# Patient Record
Sex: Female | Born: 1935 | Race: White | Hispanic: No | Smoking: Never smoker
Health system: Southern US, Community
[De-identification: ages and names within clinical notes are randomized; demographics above are authoritative.]

## PROBLEM LIST (undated history)

## (undated) DIAGNOSIS — I4891 Unspecified atrial fibrillation: Secondary | ICD-10-CM

## (undated) DIAGNOSIS — I350 Nonrheumatic aortic (valve) stenosis: Secondary | ICD-10-CM

## (undated) DIAGNOSIS — R0602 Shortness of breath: Secondary | ICD-10-CM

## (undated) HISTORY — DX: Nonrheumatic aortic (valve) stenosis: I35.0

## (undated) HISTORY — DX: Shortness of breath: R06.02

## (undated) HISTORY — DX: Unspecified atrial fibrillation: I48.91

---

## 1999-01-25 ENCOUNTER — Ambulatory Visit (HOSPITAL_COMMUNITY): Admission: RE | Admit: 1999-01-25 | Discharge: 1999-01-25 | Payer: Self-pay | Admitting: Obstetrics & Gynecology

## 1999-05-26 ENCOUNTER — Other Ambulatory Visit: Admission: RE | Admit: 1999-05-26 | Discharge: 1999-05-26 | Payer: Self-pay | Admitting: *Deleted

## 1999-06-16 ENCOUNTER — Ambulatory Visit (HOSPITAL_COMMUNITY): Admission: RE | Admit: 1999-06-16 | Discharge: 1999-06-16 | Payer: Self-pay | Admitting: *Deleted

## 2000-06-06 ENCOUNTER — Ambulatory Visit (HOSPITAL_COMMUNITY): Admission: RE | Admit: 2000-06-06 | Discharge: 2000-06-06 | Payer: Self-pay | Admitting: General Surgery

## 2000-06-06 ENCOUNTER — Encounter (HOSPITAL_BASED_OUTPATIENT_CLINIC_OR_DEPARTMENT_OTHER): Payer: Self-pay | Admitting: General Surgery

## 2000-06-15 ENCOUNTER — Encounter: Payer: Self-pay | Admitting: Internal Medicine

## 2000-06-15 ENCOUNTER — Ambulatory Visit (HOSPITAL_COMMUNITY): Admission: RE | Admit: 2000-06-15 | Discharge: 2000-06-15 | Payer: Self-pay | Admitting: Internal Medicine

## 2001-06-20 ENCOUNTER — Ambulatory Visit (HOSPITAL_COMMUNITY): Admission: RE | Admit: 2001-06-20 | Discharge: 2001-06-20 | Payer: Self-pay | Admitting: Internal Medicine

## 2001-06-20 ENCOUNTER — Encounter: Payer: Self-pay | Admitting: Internal Medicine

## 2002-06-26 ENCOUNTER — Ambulatory Visit (HOSPITAL_COMMUNITY): Admission: RE | Admit: 2002-06-26 | Discharge: 2002-06-26 | Payer: Self-pay | Admitting: Internal Medicine

## 2002-06-26 ENCOUNTER — Encounter: Payer: Self-pay | Admitting: Internal Medicine

## 2002-12-19 ENCOUNTER — Ambulatory Visit (HOSPITAL_COMMUNITY): Admission: RE | Admit: 2002-12-19 | Discharge: 2002-12-19 | Payer: Self-pay | Admitting: Gastroenterology

## 2002-12-19 ENCOUNTER — Encounter (INDEPENDENT_AMBULATORY_CARE_PROVIDER_SITE_OTHER): Payer: Self-pay | Admitting: *Deleted

## 2003-06-29 ENCOUNTER — Ambulatory Visit (HOSPITAL_COMMUNITY): Admission: RE | Admit: 2003-06-29 | Discharge: 2003-06-29 | Payer: Self-pay | Admitting: Internal Medicine

## 2004-07-29 ENCOUNTER — Ambulatory Visit (HOSPITAL_COMMUNITY): Admission: RE | Admit: 2004-07-29 | Discharge: 2004-07-29 | Payer: Self-pay | Admitting: Internal Medicine

## 2005-08-02 ENCOUNTER — Ambulatory Visit (HOSPITAL_COMMUNITY): Admission: RE | Admit: 2005-08-02 | Discharge: 2005-08-02 | Payer: Self-pay | Admitting: Internal Medicine

## 2006-08-07 ENCOUNTER — Ambulatory Visit (HOSPITAL_COMMUNITY): Admission: RE | Admit: 2006-08-07 | Discharge: 2006-08-07 | Payer: Self-pay | Admitting: Internal Medicine

## 2007-08-12 ENCOUNTER — Ambulatory Visit (HOSPITAL_COMMUNITY): Admission: RE | Admit: 2007-08-12 | Discharge: 2007-08-12 | Payer: Self-pay | Admitting: Internal Medicine

## 2008-08-13 ENCOUNTER — Ambulatory Visit (HOSPITAL_COMMUNITY): Admission: RE | Admit: 2008-08-13 | Discharge: 2008-08-13 | Payer: Self-pay | Admitting: Internal Medicine

## 2009-08-16 ENCOUNTER — Ambulatory Visit (HOSPITAL_COMMUNITY): Admission: RE | Admit: 2009-08-16 | Discharge: 2009-08-16 | Payer: Self-pay | Admitting: Internal Medicine

## 2009-12-09 ENCOUNTER — Encounter: Admission: RE | Admit: 2009-12-09 | Discharge: 2009-12-09 | Payer: Self-pay | Admitting: Internal Medicine

## 2010-07-08 ENCOUNTER — Other Ambulatory Visit (HOSPITAL_COMMUNITY): Payer: Self-pay | Admitting: Internal Medicine

## 2010-07-08 DIAGNOSIS — Z0000 Encounter for general adult medical examination without abnormal findings: Secondary | ICD-10-CM

## 2010-07-08 DIAGNOSIS — Z1231 Encounter for screening mammogram for malignant neoplasm of breast: Secondary | ICD-10-CM

## 2010-08-18 ENCOUNTER — Inpatient Hospital Stay (HOSPITAL_COMMUNITY): Admission: RE | Admit: 2010-08-18 | Payer: Self-pay | Source: Ambulatory Visit

## 2010-08-22 ENCOUNTER — Ambulatory Visit (HOSPITAL_COMMUNITY)
Admission: RE | Admit: 2010-08-22 | Discharge: 2010-08-22 | Disposition: A | Discharge status: Home / Self Care | Payer: Medicare Other | Source: Ambulatory Visit | Attending: Internal Medicine | Admitting: Internal Medicine

## 2010-08-22 DIAGNOSIS — Z1231 Encounter for screening mammogram for malignant neoplasm of breast: Secondary | ICD-10-CM | POA: Insufficient documentation

## 2010-11-04 NOTE — Op Note (Signed)
NAME:  RAINIE, CRENSHAW                        ACCOUNT NO.:  [id number]   MEDICAL RECORD NO.:  [id number]                   PATIENT TYPE:  AMB   LOCATION:  ENDO                                 FACILITY:  [facility]   PHYSICIAN:  Anselmo Rod, M.D.               DATE OF BIRTH:  02/07/1924   DATE OF PROCEDURE:  12/19/2002  DATE OF DISCHARGE:                                 OPERATIVE REPORT   PROCEDURE:  Colonoscopy with cold biopsy.   ENDOSCOPIST:  Charna Elizabeth, M.D.   INSTRUMENT USED:  Olympus video colonoscope changed to a pediatric  adjustable scope.   INDICATIONS FOR PROCEDURE:  77 year old white female undergoing screening  colonoscopy, the patient had guaiac positive stools on physical exam.   PREPROCEDURE PREPARATION:  Informed consent was obtained from the patient.  The patient was fasted for eight hours prior to the procedure and prepped  with a bottle of magnesium citrate and a gallon of GoLYTELY the night prior  to the procedure.   PREPROCEDURE PHYSICAL:  Patient with stable vital signs.  Neck supple.  Chest clear to auscultation.  S1 and S2 regular.  Abdomen soft with normal  bowel sounds.   DESCRIPTION OF PROCEDURE:  The patient was placed in the left lateral  decubitus position, sedated with 60 mg of Demerol and 6 mg Versed  intravenously.  Once the patient was adequately sedated, maintained on low  flow oxygen, continuous cardiac monitoring, the Olympus video colonoscope  was advanced into the rectum to about 50 mL with difficulty.  The adult  scope was then withdrawn and the pediatric adjustable scope was used.  This  was advanced up to the cecum with changing in the patient's position from  the left lateral to supine and right lateral position and gentle application  of abdominal pressure.  Two small sessile polyps were biopsied from the  proximal right colon.  Another small sessile polyp was biopsied from 20 cm.  There was no evidence of diverticulosis, no large  masses or polyps were  seen.  Retroflexion of the rectum revealed prominent moderate sized  nonbleeding internal hemorrhoids.  The patient tolerated the procedure well  without complications.   IMPRESSION:  1. Moderate size internal nonbleeding hemorrhoids.  2. Three small sessile polyps biopsied, two from the proximal rectum and one     from 20 cm.  3. No large masses or polyps seen.  4. No evidence of diverticulosis.             RECOMMENDATIONS:  1. Await pathology results.  2. Avoid all nonsteroidals including aspirin for the next two weeks.  3. Repeat guaiacs on an outpatient basis.  4. Outpatient follow up in the next two weeks.  Anselmo Rod, M.D.   JNM/MEDQ  D:  12/19/2002  T:  12/19/2002  Job:  [id number]   cc:   Merlene Laughter. Renae Gloss, M.D.  [address]  [address]  [place]  [place] [address]  Fax: [phone]

## 2011-06-21 ENCOUNTER — Other Ambulatory Visit (HOSPITAL_COMMUNITY): Payer: Self-pay | Admitting: Internal Medicine

## 2011-06-21 DIAGNOSIS — Z1231 Encounter for screening mammogram for malignant neoplasm of breast: Secondary | ICD-10-CM

## 2011-09-01 ENCOUNTER — Ambulatory Visit (HOSPITAL_COMMUNITY)
Admission: RE | Admit: 2011-09-01 | Discharge: 2011-09-01 | Disposition: A | Discharge status: Home / Self Care | Payer: Medicare Other | Source: Ambulatory Visit | Attending: Internal Medicine | Admitting: Internal Medicine

## 2011-09-01 DIAGNOSIS — Z1231 Encounter for screening mammogram for malignant neoplasm of breast: Secondary | ICD-10-CM | POA: Insufficient documentation

## 2012-06-25 ENCOUNTER — Other Ambulatory Visit: Payer: Self-pay | Admitting: Internal Medicine

## 2012-06-25 ENCOUNTER — Ambulatory Visit
Admission: RE | Admit: 2012-06-25 | Discharge: 2012-06-25 | Disposition: A | Discharge status: Home / Self Care | Payer: Medicare Other | Source: Ambulatory Visit | Attending: Internal Medicine | Admitting: Internal Medicine

## 2012-06-25 DIAGNOSIS — R059 Cough, unspecified: Secondary | ICD-10-CM

## 2012-06-25 DIAGNOSIS — R05 Cough: Secondary | ICD-10-CM

## 2012-07-29 ENCOUNTER — Other Ambulatory Visit (HOSPITAL_COMMUNITY): Payer: Self-pay | Admitting: Internal Medicine

## 2012-07-29 DIAGNOSIS — Z1231 Encounter for screening mammogram for malignant neoplasm of breast: Secondary | ICD-10-CM

## 2012-09-24 ENCOUNTER — Ambulatory Visit (HOSPITAL_COMMUNITY)
Admission: RE | Admit: 2012-09-24 | Discharge: 2012-09-24 | Disposition: A | Discharge status: Home / Self Care | Payer: Medicare Other | Source: Ambulatory Visit | Attending: Internal Medicine | Admitting: Internal Medicine

## 2012-09-24 DIAGNOSIS — Z1231 Encounter for screening mammogram for malignant neoplasm of breast: Secondary | ICD-10-CM | POA: Insufficient documentation

## 2020-06-16 ENCOUNTER — Other Ambulatory Visit: Payer: Self-pay | Admitting: Internal Medicine

## 2020-06-16 ENCOUNTER — Ambulatory Visit
Admission: RE | Admit: 2020-06-16 | Discharge: 2020-06-16 | Disposition: A | Discharge status: Home / Self Care | Payer: Self-pay | Source: Ambulatory Visit | Attending: Internal Medicine | Admitting: Internal Medicine

## 2020-06-16 ENCOUNTER — Other Ambulatory Visit: Payer: Self-pay

## 2020-06-16 DIAGNOSIS — J189 Pneumonia, unspecified organism: Secondary | ICD-10-CM

## 2020-06-17 ENCOUNTER — Other Ambulatory Visit: Payer: Self-pay

## 2020-06-17 ENCOUNTER — Encounter: Payer: Self-pay | Admitting: Internal Medicine

## 2020-06-17 ENCOUNTER — Ambulatory Visit (INDEPENDENT_AMBULATORY_CARE_PROVIDER_SITE_OTHER): Payer: Medicare Other | Admitting: Internal Medicine

## 2020-06-17 VITALS — BP 120/70 | HR 78 | Temp 97.7°F | Ht 60.0 in | Wt 151.0 lb

## 2020-06-17 DIAGNOSIS — Z7901 Long term (current) use of anticoagulants: Secondary | ICD-10-CM | POA: Diagnosis not present

## 2020-06-17 DIAGNOSIS — I350 Nonrheumatic aortic (valve) stenosis: Secondary | ICD-10-CM

## 2020-06-17 DIAGNOSIS — J189 Pneumonia, unspecified organism: Secondary | ICD-10-CM | POA: Diagnosis not present

## 2020-06-17 DIAGNOSIS — I482 Chronic atrial fibrillation, unspecified: Secondary | ICD-10-CM

## 2020-06-17 LAB — CBC WITH DIFFERENTIAL/PLATELET
Absolute Monocytes: 686 cells/uL (ref 200–950)
Basophils Absolute: 38 cells/uL (ref 0–200)
Basophils Relative: 0.4 %
Eosinophils Absolute: 160 cells/uL (ref 15–500)
Eosinophils Relative: 1.7 %
HCT: 40.8 % (ref 35.0–45.0)
Hemoglobin: 13.8 g/dL (ref 11.7–15.5)
Lymphs Abs: 2294 cells/uL (ref 850–3900)
MCH: 29.8 pg (ref 27.0–33.0)
MCHC: 33.8 g/dL (ref 32.0–36.0)
MCV: 88.1 fL (ref 80.0–100.0)
MPV: 10.6 fL (ref 7.5–12.5)
Monocytes Relative: 7.3 %
Neutro Abs: 6223 cells/uL (ref 1500–7800)
Neutrophils Relative %: 66.2 %
Platelets: 347 10*3/uL (ref 140–400)
RBC: 4.63 10*6/uL (ref 3.80–5.10)
RDW: 13 % (ref 11.0–15.0)
Total Lymphocyte: 24.4 %
WBC: 9.4 10*3/uL (ref 3.8–10.8)

## 2020-06-17 MED ORDER — LEVOFLOXACIN 500 MG PO TABS: 500.0000 mg | ORAL_TABLET | Freq: Every day | ORAL | 0 refills | Status: DC

## 2020-06-17 NOTE — Addendum Note (Signed)
Addended by: Margaree Mackintosh on: 06/17/2020 07:45 PM   Modules accepted: Orders, Level of Service

## 2020-06-17 NOTE — Progress Notes (Addendum)
Subjective:    Patient ID: Olivia Erickson, female    DOB: 1924-06-13, 86 y.o.   MRN: [id number]  HPI 86 year old Female currently residing in [place] seen for the first time today. Her niece is Olivia Erickson, a longstanding patient in this practice, and patient is here visiting with her.  Patient has history of A-fib and is on chronic anticoagulation.  Patient was visiting her sister in  [place] recently and was seen in the emergency department on December 23.  She was complaining of fever, shortness of breath and chest discomfort.  Has been vaccinated for COVID-19.  She takes losartan 25 mg daily, metoprolol 50 mg daily, generic Crestor 10 mg daily, Eliquis 5 mg twice daily and allopurinol 100 mg daily.  In the emergency department her temperature was 99.4 degrees.  She was offered admission but felt that she could go home.  She was noted to be tachycardic in the emergency department but pulse rate is reported as 85.  Blood pressure was 161/84 and pulse oximetry was 96%.  Temperature was 99.4 degrees.  White blood cell count was 18,000.  CO2 was low at 22.  Glucose was 178.  Sodium was 135.  COVID-19 PCR test was negative.  Influenza a and B test were negative.  RSV test was negative.  Chest x-ray showed suspected patchy pneumonia in the lower lung fields with atelectasis bilaterally.  Question of a small left pleural effusion and prominent heart size.  She is now visiting with [name] who is my patient.  [name] asked that we perform repeat chest x-ray as suggested by emergency department personnel in [place].  I asked to see the patient as well.  Repeat chest x-ray done on December 29 shows aortic atherosclerosis, stable cardiomegaly, no definite pneumonia or consolidation.  Negative for edema effusion or pneumothorax.  Patient feels well and has no new complaints.       Review of Systems see above- hx of aortic stenosis     Objective:   Physical  Exam Blood pressure 120/70 pulse 78 temperature 97.7 pulse oximetry 98% weight 151 pounds BMI 29.49  Pleasant elderly female in no acute distress.  We walked her down the hall a bit and O2 sat dropped to 93%.  She ambulates with a cane.  Skin warm and dry.  Nodes none.  TMs and pharynx are clear.  Neck is supple without JVD thyromegaly or carotid bruits.  Chest is clear to auscultation except some coarse breath sounds right lower lobe.  Cardiac exam: Irregular irregular rhythm consistent with atrial fibrillation.  2/6 short systolic ejection murmur.  No lower extremity pitting edema.  She is alert.  She is cooperative.  She is a bit hard of hearing.       Assessment & Plan:  Status post recent episode of pneumonia with elevated white blood cell count of 18,000 in [place] on December 23.  Recent chest x-ray shows no pneumonia on December 29.  She is asymptomatic.Has some coarse breath sounds RLL. Continue Levaquin for additional 5 days  Aortic stenosis  Chronic anticoagulation   Atrial fibrillation  Plan: She completed 7 day course of Levaquin 500 mg daily prescribed in [place]. Will prescribe additional 5 days today. We repeated CBC today as her white blood cell count was elevated at ER visit in [place] on December 23 at 18,000.  She is planning to return to [place] in the near future.  Records faxed to Dr.  Marrianne Mood at [facility] in [place] and Fax# is [phone]  who is her regular primary care physician

## 2020-06-17 NOTE — Patient Instructions (Addendum)
It was a pleasure to see you today.  Take additional 5 days of Levaquin 500 mg daily  We have repeated CBC today and will notify you of result when office reopens next week.

## 2020-06-30 ENCOUNTER — Telehealth: Payer: Self-pay

## 2020-06-30 NOTE — Telephone Encounter (Signed)
NOTES ON FILE FROM [facility] [phone] SENT REFERRAL TO SCHEDULING

## 2020-07-20 ENCOUNTER — Ambulatory Visit: Payer: Medicare Other | Admitting: Cardiology

## 2020-08-11 NOTE — Progress Notes (Addendum)
Cardiology Office Note:    Date:  08/12/2020   ID:  Olivia Erickson, DOB 23-Apr-1924, MRN [id number]  PCP:  Patient, No Pcp Per  Cardiologist:  No primary care provider on file.   Referring MD: No ref. provider found   Chief Complaint  Patient presents with  . Atrial Fibrillation  . Coronary Artery Disease  . Cardiac Valve Problem    Severe aortic stenosis    History of Present Illness:    Olivia Erickson is a 87 y.o. female with a hx of chronic atrial fibrillation, type 2 diabetes mellitus, severe aortic stenosis, mixed hyperlipidemia, hypertensive kidney disease, coronary atherosclerosis with multivessel disease and stent placement in the proximal RCA 2019 and mid LAD and circumflex, chronic anticoagulation therapy, decreased hearing, primary hypertension.  Previously followed by Dr. Earlyne Iba at [facility] in [facility] [place].  Last seen in October 2021 when the most recent echocardiogram was performed.  Primary care in [place], Dr. Eden Emms Baxley  This is a pleasant 87 year old who does not appear her age.  She does have difficulty hearing.  She denies syncope, angina, significant dyspnea on exertion, orthopnea, and edema.  She has a history of coronary disease.  No recent angina or nitroglycerin use.  She has moved to [place] from [facility] [place].  She understands that she has significant aortic valve stenosis and also has chronic atrial fibrillation with CAD.  She is here to establish for chronic longitudinal follow-up.  She has established with Dr. Eden Emms Baxley.  Past Medical History:  Diagnosis Date  . A-fib (HCC)   . Aortic stenosis   . SOB (shortness of breath)     History reviewed. No pertinent surgical history.  Current Medications: Current Meds  Medication Sig  . allopurinol (ZYLOPRIM) 100 MG tablet Take 100 mg by mouth daily.  . Cholecalciferol (VITAMIN D3) 50 MCG (2000 UT) TABS Take 2,000 Units/day by mouth daily.  Marland Kitchen  ELIQUIS 5 MG TABS tablet Take 2.5 mg by mouth 2 (two) times daily.  Marland Kitchen losartan (COZAAR) 25 MG tablet Take 25 mg by mouth daily.  . metoprolol succinate (TOPROL-XL) 50 MG 24 hr tablet Take 50 mg by mouth daily.  . rosuvastatin (CRESTOR) 10 MG tablet Take 10 mg by mouth daily.  . vitamin C (ASCORBIC ACID) 500 MG tablet Take 500 mg by mouth daily.     Allergies:   Patient has no known allergies.   Social History   Socioeconomic History  . Marital status: Divorced    Spouse name: Not on file  . Number of children: Not on file  . Years of education: Not on file  . Highest education level: Not on file  Occupational History  . Not on file  Tobacco Use  . Smoking status: Never Smoker  . Smokeless tobacco: Never Used  Substance and Sexual Activity  . Alcohol use: Not Currently  . Drug use: Not on file  . Sexual activity: Not on file  Other Topics Concern  . Not on file  Social History Narrative  . Not on file   Social Determinants of Health   Financial Resource Strain: Not on file  Food Insecurity: Not on file  Transportation Needs: Not on file  Physical Activity: Not on file  Stress: Not on file  Social Connections: Not on file     Family History: The patient's family history includes Heart attack in her father and mother; Heart disease in her father and mother.  ROS: No bleeding on Eliquis. Please see the history of present illness.    Decreased hearing, joint discomfort associated with osteoarthritis.  All other systems reviewed and are negative.  EKGs/Labs/Other Studies Reviewed:    The following studies were reviewed today:  2D Doppler echocardiogram March 22, 2020:  [id number]%  PA systolic pressure 30 mmHg  Severe aortic stenosis with mean gradient 38 mmHg and peak instantaneous velocity 3.8 m/s with calculated aortic valve area 0.46 cm  EKG:  EKG atrial fibrillation, nonspecific T wave flattening, otherwise unremarkable.  Recent Labs: 06/17/2020:  Hemoglobin 13.8; Platelets 347  Recent Lipid Panel No results found for: CHOL, TRIG, HDL, CHOLHDL, VLDL, LDLCALC, LDLDIRECT  Physical Exam:    VS:  BP 122/74   Pulse 83   Ht 5\' 3"  (1.6 m)   Wt 153 lb 3.2 oz (69.5 kg)   SpO2 97%   BMI 27.14 kg/m     Wt Readings from Last 3 Encounters:  08/12/20 153 lb 3.2 oz (69.5 kg)  06/17/20 151 lb (68.5 kg)     GEN: Slightly overweight, and appears younger than stated age.. No acute distress HEENT: Normal NECK: No JVD. LYMPHATICS: No lymphadenopathy CARDIAC: 3/6 to 4/6 crescendo decrescendo aortic stenosis murmur at right upper sternal and left mid sternal positions..  Irregularly irregular RR without gallop, or edema. VASCULAR:  Normal Pulses. No bruits. RESPIRATORY:  Clear to auscultation without rales, wheezing or rhonchi  ABDOMEN: Soft, non-tender, non-distended, No pulsatile mass, MUSCULOSKELETAL: No deformity  SKIN: Warm and dry NEUROLOGIC:  Alert and oriented x 3 PSYCHIATRIC:  Normal affect   ASSESSMENT:    1. Aortic stenosis, severe   2. Chronic atrial fibrillation (HCC)   3. Coronary artery disease of native artery of native heart with stable angina pectoris (HCC)   4. Primary hypertension   5. Type 2 diabetes mellitus with complication, without long-term current use of insulin (HCC)   6. Chronic anticoagulation   7. Educated about COVID-19 virus infection    PLAN:    In order of problems listed above:  1. Severe aortic stenosis and is relatively well-preserved 87 year old.  We discussed clinical management.  Had a conversation concerning her acceptance of the clinical condition and preferences for management.  At this point, she is not opposed to performing percutaneous valve replacement.  No real time line.  Did discuss the need to perform the procedure when she is clinically optimal as this would give the best result.  Made it clear that any decision concerning procedure related management would be a shared decision  process and based upon recommendations from the structural heart/aortic valve clinic.  Plan perform an echo in April and compare to the October echocardiogram.  Clinical follow-up here in the office at that time.  Decision concerning moving forward with TAVR evaluation will be determined at that time.  She is ambulatory, independent, and not particularly frail.  If she is going to have a procedure, it should be done while she is physically optimal. However, even when optimal, she is 87 yo which increases the chance for poor outcome. In absence of symptoms, my inclination is conservative management. 2. Controlled rate.  Continue chronic anticoagulation. 3. Secondary prevention.  Monitor for symptoms of angina. 4. Continue current medical therapy.  No change in regimen.  Therapy will be continuation of losartan 25 mg/day, Toprol-XL 50 mg/day. 5. No specific therapy at this time. 6. Continue Eliquis currently on 2.5 mg/day.  This may be an under treatment as kidney function  and weight would dictate a higher dose.  Will revisit with pharmacy. 7. Vaccinated and has not suffered COVID-19.   Medication Adjustments/Labs and Tests Ordered: Current medicines are reviewed at length with the patient today.  Concerns regarding medicines are outlined above.  Orders Placed This Encounter  Procedures  . EKG 12-Lead  . ECHOCARDIOGRAM COMPLETE   No orders of the defined types were placed in this encounter.   Patient Instructions  Medication Instructions:  Your physician recommends that you continue on your current medications as directed. Please refer to the Current Medication list given to you today.  *If you need a refill on your cardiac medications before your next appointment, please call your pharmacy*   Lab Work: None If you have labs (blood work) drawn today and your tests are completely normal, you will receive your results only by: Marland Kitchen MyChart Message (if you have MyChart) OR . A paper copy in the  mail If you have any lab test that is abnormal or we need to change your treatment, we will call you to review the results.   Testing/Procedures: Your physician has requested that you have an echocardiogram. Echocardiography is a painless test that uses sound waves to create images of your heart. It provides your doctor with information about the size and shape of your heart and how well your heart's chambers and valves are working. This procedure takes approximately one hour. There are no restrictions for this procedure.     Follow-Up: At [facility], you and your health needs are our priority.  As part of our continuing mission to provide you with exceptional heart care, we have created designated Provider Care Teams.  These Care Teams include your primary Cardiologist (physician) and Advanced Practice Providers (APPs -  Physician Assistants and Nurse Practitioners) who all work together to provide you with the care you need, when you need it.  We recommend signing up for the patient portal called "MyChart".  Sign up information is provided on this After Visit Summary.  MyChart is used to connect with patients for Virtual Visits (Telemedicine).  Patients are able to view lab/test results, encounter notes, upcoming appointments, etc.  Non-urgent messages can be sent to your provider as well.   To learn more about what you can do with MyChart, go to [email or web address].    Your next appointment:   2 month(s)  The format for your next appointment:   In Person  Provider:   You may see Dr. Verdis Prime or one of the following Advanced Practice Providers on your designated Care Team:    Georgie Chard, NP    Other Instructions      Signed, Lesleigh Noe, MD  08/12/2020 5:28 PM    [facility]

## 2020-08-12 ENCOUNTER — Encounter: Payer: Self-pay | Admitting: Interventional Cardiology

## 2020-08-12 ENCOUNTER — Other Ambulatory Visit: Payer: Self-pay

## 2020-08-12 ENCOUNTER — Ambulatory Visit: Payer: Medicare Other | Admitting: Interventional Cardiology

## 2020-08-12 VITALS — BP 122/74 | HR 83 | Ht 63.0 in | Wt 153.2 lb

## 2020-08-12 DIAGNOSIS — I482 Chronic atrial fibrillation, unspecified: Secondary | ICD-10-CM

## 2020-08-12 DIAGNOSIS — I350 Nonrheumatic aortic (valve) stenosis: Secondary | ICD-10-CM

## 2020-08-12 DIAGNOSIS — I10 Essential (primary) hypertension: Secondary | ICD-10-CM | POA: Diagnosis not present

## 2020-08-12 DIAGNOSIS — Z7189 Other specified counseling: Secondary | ICD-10-CM

## 2020-08-12 DIAGNOSIS — I25118 Atherosclerotic heart disease of native coronary artery with other forms of angina pectoris: Secondary | ICD-10-CM

## 2020-08-12 DIAGNOSIS — E118 Type 2 diabetes mellitus with unspecified complications: Secondary | ICD-10-CM

## 2020-08-12 DIAGNOSIS — Z7901 Long term (current) use of anticoagulants: Secondary | ICD-10-CM

## 2020-08-12 NOTE — Patient Instructions (Signed)
Medication Instructions:  Your physician recommends that you continue on your current medications as directed. Please refer to the Current Medication list given to you today.  *If you need a refill on your cardiac medications before your next appointment, please call your pharmacy*   Lab Work: None If you have labs (blood work) drawn today and your tests are completely normal, you will receive your results only by: Marland Kitchen MyChart Message (if you have MyChart) OR . A paper copy in the mail If you have any lab test that is abnormal or we need to change your treatment, we will call you to review the results.   Testing/Procedures: Your physician has requested that you have an echocardiogram. Echocardiography is a painless test that uses sound waves to create images of your heart. It provides your doctor with information about the size and shape of your heart and how well your heart's chambers and valves are working. This procedure takes approximately one hour. There are no restrictions for this procedure.     Follow-Up: At [facility], you and your health needs are our priority.  As part of our continuing mission to provide you with exceptional heart care, we have created designated Provider Care Teams.  These Care Teams include your primary Cardiologist (physician) and Advanced Practice Providers (APPs -  Physician Assistants and Nurse Practitioners) who all work together to provide you with the care you need, when you need it.  We recommend signing up for the patient portal called "MyChart".  Sign up information is provided on this After Visit Summary.  MyChart is used to connect with patients for Virtual Visits (Telemedicine).  Patients are able to view lab/test results, encounter notes, upcoming appointments, etc.  Non-urgent messages can be sent to your provider as well.   To learn more about what you can do with MyChart, go to [email or web address].    Your next appointment:   2  month(s)  The format for your next appointment:   In Person  Provider:   You may see Dr. Verdis Prime or one of the following Advanced Practice Providers on your designated Care Team:    Georgie Chard, NP    Other Instructions

## 2020-08-15 ENCOUNTER — Other Ambulatory Visit: Payer: Self-pay

## 2020-08-15 ENCOUNTER — Emergency Department (HOSPITAL_COMMUNITY): Payer: Medicare Other

## 2020-08-15 ENCOUNTER — Emergency Department (HOSPITAL_COMMUNITY)
Admission: EM | Admit: 2020-08-15 | Discharge: 2020-08-15 | Disposition: A | Discharge status: Home / Self Care | Payer: Medicare Other | Attending: Emergency Medicine | Admitting: Emergency Medicine

## 2020-08-15 ENCOUNTER — Encounter (HOSPITAL_COMMUNITY): Payer: Self-pay

## 2020-08-15 DIAGNOSIS — E119 Type 2 diabetes mellitus without complications: Secondary | ICD-10-CM | POA: Insufficient documentation

## 2020-08-15 DIAGNOSIS — R011 Cardiac murmur, unspecified: Secondary | ICD-10-CM | POA: Diagnosis not present

## 2020-08-15 DIAGNOSIS — I129 Hypertensive chronic kidney disease with stage 1 through stage 4 chronic kidney disease, or unspecified chronic kidney disease: Secondary | ICD-10-CM | POA: Insufficient documentation

## 2020-08-15 DIAGNOSIS — Z7901 Long term (current) use of anticoagulants: Secondary | ICD-10-CM | POA: Diagnosis not present

## 2020-08-15 DIAGNOSIS — R42 Dizziness and giddiness: Secondary | ICD-10-CM | POA: Insufficient documentation

## 2020-08-15 DIAGNOSIS — I2510 Atherosclerotic heart disease of native coronary artery without angina pectoris: Secondary | ICD-10-CM | POA: Diagnosis not present

## 2020-08-15 DIAGNOSIS — R102 Pelvic and perineal pain: Secondary | ICD-10-CM | POA: Insufficient documentation

## 2020-08-15 DIAGNOSIS — F0390 Unspecified dementia without behavioral disturbance: Secondary | ICD-10-CM | POA: Insufficient documentation

## 2020-08-15 DIAGNOSIS — N183 Chronic kidney disease, stage 3 unspecified: Secondary | ICD-10-CM | POA: Diagnosis not present

## 2020-08-15 DIAGNOSIS — W19XXXA Unspecified fall, initial encounter: Secondary | ICD-10-CM | POA: Diagnosis not present

## 2020-08-15 DIAGNOSIS — Z79899 Other long term (current) drug therapy: Secondary | ICD-10-CM | POA: Diagnosis not present

## 2020-08-15 LAB — CBC WITH DIFFERENTIAL/PLATELET
Abs Immature Granulocytes: 0.07 10*3/uL (ref 0.00–0.07)
Basophils Absolute: 0 10*3/uL (ref 0.0–0.1)
Basophils Relative: 0 %
Eosinophils Absolute: 0.1 10*3/uL (ref 0.0–0.5)
Eosinophils Relative: 0 %
HCT: 43.1 % (ref 36.0–46.0)
Hemoglobin: 14.2 g/dL (ref 12.0–15.0)
Immature Granulocytes: 1 %
Lymphocytes Relative: 11 %
Lymphs Abs: 1.4 10*3/uL (ref 0.7–4.0)
MCH: 28.9 pg (ref 26.0–34.0)
MCHC: 32.9 g/dL (ref 30.0–36.0)
MCV: 87.6 fL (ref 80.0–100.0)
Monocytes Absolute: 0.8 10*3/uL (ref 0.1–1.0)
Monocytes Relative: 6 %
Neutro Abs: 10.7 10*3/uL — ABNORMAL HIGH (ref 1.7–7.7)
Neutrophils Relative %: 82 %
Platelets: 227 10*3/uL (ref 150–400)
RBC: 4.92 MIL/uL (ref 3.87–5.11)
RDW: 13.5 % (ref 11.5–15.5)
WBC: 13 10*3/uL — ABNORMAL HIGH (ref 4.0–10.5)
nRBC: 0 % (ref 0.0–0.2)

## 2020-08-15 LAB — COMPREHENSIVE METABOLIC PANEL
ALT: 16 U/L (ref 0–44)
AST: 19 U/L (ref 15–41)
Albumin: 3.8 g/dL (ref 3.5–5.0)
Alkaline Phosphatase: 46 U/L (ref 38–126)
Anion gap: 12 (ref 5–15)
BUN: 16 mg/dL (ref 8–23)
CO2: 24 mmol/L (ref 22–32)
Calcium: 9.5 mg/dL (ref 8.9–10.3)
Chloride: 104 mmol/L (ref 98–111)
Creatinine, Ser: 0.98 mg/dL (ref 0.44–1.00)
GFR, Estimated: 53 mL/min — ABNORMAL LOW (ref >60)
Glucose, Bld: 127 mg/dL — ABNORMAL HIGH (ref 70–99)
Potassium: 4.5 mmol/L (ref 3.5–5.1)
Sodium: 140 mmol/L (ref 135–145)
Total Bilirubin: 2 mg/dL — ABNORMAL HIGH (ref 0.3–1.2)
Total Protein: 6.2 g/dL — ABNORMAL LOW (ref 6.5–8.1)

## 2020-08-15 LAB — CK: Total CK: 129 U/L (ref 38–234)

## 2020-08-15 LAB — CBG MONITORING, ED: Glucose-Capillary: 136 mg/dL — ABNORMAL HIGH (ref 70–99)

## 2020-08-15 MED ORDER — SODIUM CHLORIDE 0.9 % IV BOLUS
500.0000 mL | Freq: Once | INTRAVENOUS | Status: AC
  Administered 2020-08-15: 500 mL via INTRAVENOUS

## 2020-08-15 NOTE — ED Triage Notes (Signed)
Pt from [facility] for a fall. Pt was trying to go to the bathroom early this morning when she felt dizzy and fell, pt denies hitting her head or LOC. Pt only c.o pain to her buttocks after scooting on the floor, pt was on the floor for approximately 4-6 hrs. Pt arrives alert, oriented, VSS

## 2020-08-15 NOTE — ED Notes (Signed)
Patient transported to x-ray. ?

## 2020-08-15 NOTE — ED Notes (Signed)
Pt ambulated in room with steady gait  

## 2020-08-15 NOTE — ED Provider Notes (Signed)
[facility] EMERGENCY DEPARTMENT Provider Note   CSN: [id number] Arrival date & time: 08/15/20  1140     History Chief Complaint  Patient presents with  . Dizziness  . Fall    Olivia Erickson is a 87 y.o. female.  HPI     87yo female with history of atrial fibrillation on eliquis, Htn, severe aortic stenosis, CKD stage 3, DM, mixed hyperlipidemia, CAD with stents 2019, mild dementia, who recently moved from [facility] and resides in [facility] who presents with concern for fall.  Reports she fell sometime before sunrise early this AM while she was on the way to the bathroom and couldn't get back up.  She slid on her bottom all around her apartment--Tried to pull red cord to indicate she had fallen but help did not come. She then slid on her bottom to the front door and used her cane to unlock and open it and someone walked through the hallway and she told her she could not get up. She found help and they called EMS.  She reports her bottom is sore from sliding all around her home but denies any other symptoms. No chest pain or dyspnea.  Thinks she might have been dizzy when she went from sitting to standing which is normal for her. Denies syncope or LOC. Denies hitting her head, headache, neck pain, numbness/weakness, urinary symptoms or cough.    Family checking records of Cr-Admitted to [facility] for pneumonia previously had Cr .9  05/2020  Past Medical History:  Diagnosis Date  . A-fib (HCC)   . Aortic stenosis   . SOB (shortness of breath)     There are no problems to display for this patient.   History reviewed. No pertinent surgical history.   OB History   No obstetric history on file.     Family History  Problem Relation Age of Onset  . Heart attack Mother   . Heart disease Mother   . Heart attack Father   . Heart disease Father     Social History   Tobacco Use  . Smoking status: Never Smoker  . Smokeless tobacco: Never  Used  Substance Use Topics  . Alcohol use: Not Currently    Home Medications Prior to Admission medications   Medication Sig Start Date End Date Taking? Authorizing Provider  allopurinol (ZYLOPRIM) 100 MG tablet Take 100 mg by mouth daily. 05/20/20   [provider]  Cholecalciferol (VITAMIN D3) 50 MCG (2000 UT) TABS Take 2,000 Units/day by mouth daily.    [provider]  ELIQUIS 5 MG TABS tablet Take 2.5 mg by mouth 2 (two) times daily. 05/12/20   [provider]  losartan (COZAAR) 25 MG tablet Take 25 mg by mouth daily. 04/15/20   [provider]  metoprolol succinate (TOPROL-XL) 50 MG 24 hr tablet Take 50 mg by mouth daily. 05/20/20   [provider]  rosuvastatin (CRESTOR) 10 MG tablet Take 10 mg by mouth daily. 05/20/20   [provider]  vitamin C (ASCORBIC ACID) 500 MG tablet Take 500 mg by mouth daily.    [provider]    Allergies    Patient has no known allergies.  Review of Systems   Review of Systems  Constitutional: Negative for fever.  HENT: Negative for sore throat.   Eyes: Negative for visual disturbance.  Respiratory: Negative for cough and shortness of breath.   Cardiovascular: Negative for chest pain.  Gastrointestinal: Negative for  abdominal pain, nausea and vomiting.  Genitourinary: Negative for difficulty urinating and dysuria.  Musculoskeletal: Negative for back pain and neck pain.  Skin: Negative for rash.  Neurological: Positive for light-headedness. Negative for dizziness, syncope, facial asymmetry, weakness, numbness and headaches.    Physical Exam Updated Vital Signs BP (!) 154/94   Pulse 93   Temp (!) 97.5 F (36.4 C) (Oral)   Resp (!) 23   Ht 5\' 3"  (1.6 m)   Wt 69.5 kg   SpO2 98%   BMI 27.14 kg/m   Physical Exam Vitals and nursing note reviewed.  Constitutional:      General: She is not in acute distress.    Appearance: She is well-developed and well-nourished. She is not  diaphoretic.  HENT:     Head: Normocephalic and atraumatic.  Eyes:     General: No visual field deficit.    Extraocular Movements: EOM normal.     Conjunctiva/sclera: Conjunctivae normal.  Cardiovascular:     Rate and Rhythm: Normal rate and regular rhythm.     Pulses: Intact distal pulses.     Heart sounds: Murmur heard.  No friction rub. No gallop.   Pulmonary:     Effort: Pulmonary effort is normal. No respiratory distress.     Breath sounds: Normal breath sounds. No wheezing or rales.  Abdominal:     General: There is no distension.     Palpations: Abdomen is soft.     Tenderness: There is no abdominal tenderness. There is no guarding.  Musculoskeletal:        General: No tenderness (denies pain C/T/L spine, normal ROM painless ROM hips) or edema.     Cervical back: Normal range of motion.  Skin:    General: Skin is warm and dry.     Findings: No erythema or rash.  Neurological:     Mental Status: She is alert and oriented to person, place, and time.     GCS: GCS eye subscore is 4. GCS verbal subscore is 5. GCS motor subscore is 6.     Cranial Nerves: Cranial nerves are intact. No cranial nerve deficit, dysarthria or facial asymmetry.     Sensory: Sensation is intact. No sensory deficit.     Motor: Motor function is intact. No weakness or pronator drift.     Coordination: Coordination is intact.     ED Results / Procedures / Treatments   Labs (all labs ordered are listed, but only abnormal results are displayed) Labs Reviewed  CBC WITH DIFFERENTIAL/PLATELET - Abnormal; Notable for the following components:      Result Value   WBC 13.0 (*)    Neutro Abs 10.7 (*)    All other components within normal limits  COMPREHENSIVE METABOLIC PANEL - Abnormal; Notable for the following components:   Glucose, Bld 127 (*)    Total Protein 6.2 (*)    Total Bilirubin 2.0 (*)    GFR, Estimated 53 (*)    All other components within normal limits  CBG MONITORING, ED - Abnormal;  Notable for the following components:   Glucose-Capillary 136 (*)    All other components within normal limits  CK    EKG EKG Interpretation  Date/Time:  Sunday August 15 2020 11:48:30 EST Ventricular Rate:  93 PR Interval:    QRS Duration: 83 QT Interval:  405 QTC Calculation: 504 R Axis:   51 Text Interpretation: Atrial fibrillation Ventricular premature complex Nonspecific repol abnormality, lateral leads Prolonged QT interval No previous ECGs  available Confirmed by Alvira MondaySchlossman, Janelle Spellman ([id number]) on 08/15/2020 12:06:10 PM   Radiology DG Pelvis 1-2 Views  Result Date: 08/15/2020 CLINICAL DATA:  Status post fall with buttock pain. EXAM: PELVIS - 1-2 VIEW COMPARISON:  None. FINDINGS: There is no evidence of pelvic fracture or diastasis. No pelvic bone lesions are seen. IMPRESSION: No evidence of pelvic fractures. If fracture of the sacrum is suspected, dedicated sacral radiographs should be considered. Electronically Signed   By: Ted Mcalpineobrinka  Dimitrova M.D.   On: 08/15/2020 13:13   CT Head Wo Contrast  Result Date: 08/15/2020 CLINICAL DATA:  Fall EXAM: CT HEAD WITHOUT CONTRAST TECHNIQUE: Contiguous axial images were obtained from the base of the skull through the vertex without intravenous contrast. COMPARISON:  None. FINDINGS: Brain: No evidence of acute infarction, hemorrhage, hydrocephalus, extra-axial collection or mass lesion/mass effect. Global parenchymal volume loss. Periventricular white matter hypodensities consistent with sequela of chronic microvascular ischemic disease. Vascular: Vascular calcifications of the carotid siphons. Skull: Normal. Negative for fracture or focal lesion. Sinuses/Orbits: No acute finding. Other: None. IMPRESSION: 1.  No acute intracranial abnormality. Electronically Signed   By: Meda KlinefelterStephanie  Peacock MD   On: 08/15/2020 13:35   CT Cervical Spine Wo Contrast  Result Date: 08/15/2020 CLINICAL DATA:  Fall EXAM: CT CERVICAL SPINE WITHOUT CONTRAST TECHNIQUE:  Multidetector CT imaging of the cervical spine was performed without intravenous contrast. Multiplanar CT image reconstructions were also generated. COMPARISON:  None. FINDINGS: Alignment: Normal. Skull base and vertebrae: Minimal asymmetric widening of the RIGHT facet of C2-3, favored degenerative in etiology. No acute fracture. Lucency seen on coronal view only of the posterior aspect of the RIGHT C2 vertebral body is consistent with a nutrient foramen. Soft tissues and spinal canal: No prevertebral fluid or swelling. No visible canal hematoma. Disc levels: Multilevel intervertebral disc space height loss, severe at C3-4 and C4-5. Is moderate at C5-6 and C6-7. Upper chest: Biapical irregular ground-glass and consolidative opacities, possibly scarring, incompletely assessed. RIGHT apical irregular opacity span 16 x 11 mm, LEFT apical opacity spans 17 x 13 mm. Coarse calcification of the LEFT thyroid. Atherosclerotic calcifications of the carotids. Other: None IMPRESSION: 1. No definitive acute fracture or static subluxation of the cervical spine. Minimal asymmetric widening of the RIGHT facet of C2-3 is favored to be degenerative in etiology given asymmetric facet arthropathy. If there is clinical concern for fracture or ligamentous injury, MRI could be obtained for further evaluation. 2. Biapical irregular ground-glass and consolidative opacities, possibly scarring. This is incompletely assessed. Dedicated chest CT could be obtained for further evaluation if clinically indicated. Electronically Signed   By: Meda KlinefelterStephanie  Peacock MD   On: 08/15/2020 14:22    Procedures Procedures   Medications Ordered in ED Medications  sodium chloride 0.9 % bolus 500 mL (0 mLs Intravenous Stopped 08/15/20 1432)    ED Course  I have reviewed the triage vital signs and the nursing notes.  Pertinent labs & imaging results that were available during my care of the patient were reviewed by me and considered in my medical  decision making (see chart for details).    MDM Rules/Calculators/A&P                          87yo female with history of atrial fibrillation on eliquis, Htn, severe aortic stenosis, CKD stage 3, DM, mixed hyperlipidemia, CAD with stents 2019, mild dementia, who [facility] and [facility] in [facility] who presents with concern for fall.  Reports gluteal pain from sliding around on the floor and denies other pain or injuries.  She was unfortunately unable to get up and the mechanism installed in her independent living facility room to call for help was not functional.  Given this, labs were obtained which show no evidence of rhabdomyolysis with normal renal function for patient and normal CK.  Mild leukocytosis of 13 may be secondary to stress or trauma, she is afebrile without any symptoms including no urinary symptoms, cough or generalized weakness.  She denies syncope but notes chronic lightheadedness when she goes from sitting to standing. Do not feel she requires admission given no report of syncope.   Obtained CT head and CSpine given fall on anticoagulation which shows no evidence of ICH. She does not have cervical spine tenderness and doubt asymmetric widening reported is acute fracture given no pain and possibility that this may be degenerative in etiology.   CT also shows biapical irregular ground glass opacities which may be scarring--given no respiratory symptoms, no hypoxia, do not feel this requires emergent CT chest.  XR pelvis shows no evidence of fracture and she was able to ambulate in the ED without pain.  Recommend follow up with PCP and consider earlier Cardiology follow up given lightheadedness in setting of aortic stenosis (although she reports unchanged.)  Daughter is staying with her tonight.  Patient discharged in stable condition with understanding of reasons to return.     Final Clinical Impression(s) / ED Diagnoses Final diagnoses:  Fall,  initial encounter  Pain in pelvis    Rx / DC Orders ED Discharge Orders    None       Alvira Monday, MD 08/15/20 1545

## 2020-10-05 ENCOUNTER — Ambulatory Visit (HOSPITAL_COMMUNITY): Payer: Medicare Other | Attending: Interventional Cardiology

## 2020-10-05 ENCOUNTER — Other Ambulatory Visit: Payer: Self-pay

## 2020-10-05 DIAGNOSIS — I350 Nonrheumatic aortic (valve) stenosis: Secondary | ICD-10-CM | POA: Insufficient documentation

## 2020-10-05 LAB — ECHOCARDIOGRAM COMPLETE
AR max vel: 0.32 cm2
AV Area VTI: 0.3 cm2
AV Area mean vel: 0.3 cm2
AV Mean grad: 45 mmHg
AV Peak grad: 68.6 mmHg
Ao pk vel: 4.14 m/s
Area-P 1/2: 3.94 cm2
MV M vel: 5.75 m/s
MV Peak grad: 132.3 mmHg
Radius: 0.4 cm
S' Lateral: 2.8 cm

## 2020-10-13 NOTE — Progress Notes (Signed)
Cardiology Office Note:    Date:  10/14/2020   ID:  Olivia Erickson, DOB [birth year], MRN [id number]  PCP:  Patient, No Pcp Per (Inactive)  Cardiologist:  Lesleigh Noe, MD   Referring MD: No ref. provider found   Chief Complaint  Patient presents with  . Follow-up    Calcific aortic stenosis, severe    History of Present Illness:    Olivia Erickson is a 87 y.o. female with a hx of chronic atrial fibrillation, type 2 diabetes mellitus, severe aortic stenosis, mixed hyperlipidemia, hypertensive kidney disease, coronary atherosclerosis with multivessel disease and stent placement in the proximal RCA 2019 and mid LAD and circumflex, chronic anticoagulation therapy, decreased hearing, primary hypertension.  Previously followed by Dr. Earlyne Iba at [facility] in [facility] [place].  Last seen in October 2021 when the most recent echocardiogram was performed.  Primary care in [place], Dr. Sharlet Salina  The patient is accompanied by her daughter-in-law and son.  Her daughter Olivia Erickson is not available for today's visit.  The patient ambulated and using a rolling walker.  She describes some shortness of breath with tying her shoes.  If her legs dangle most of the time while sitting, she develops progressive lower extremity edema.  She denies orthopnea.  She is not having angina.  She has not had syncope.  She has had no subsequent falls.  On the last visit there have been 2 falls associated with ambulation prior to that visit.  The recent echo done here within the past month demonstrates severe aortic stenosis with a transaortic valve velocity of 4.14 m/s.  This qualifies as severe aortic stenosis.  We had an extended conversation today concerning management options.  The patient is happy with her current quality of life.  The family assures me that she has excellent cognitive function.  We discussed treatment of severe aortic stenosis covering surgical aortic valve  replacement, transaortic valve replacement, and conservative medical management.  Both the patient and I feel that open surgery is not an option for the patient at her age and in her current condition.  We spent significant time discussing TAVR.  We discussed the potential risks associated with TAVR as being requirement for pacemaker, stroke, bleeding, vascular injury, and arrhythmia.  The patient is very satisfied with her current quality of life.  She has a living will and does not want extraordinary measures such as CPR and advanced life support.  The patient at this point chooses conservative medical management for aortic stenosis.  Has a shared decision making exercise, I completely agree with this decision.  Her son who is present today is also in agreement with conservative medical management.  This will be an enduring decision.  I have also asked that they give [place] a copy of her DNR.  They keep the DNR on the refrigerator at her residence at [facility].  Past Medical History:  Diagnosis Date  . A-fib (HCC)   . Aortic stenosis   . SOB (shortness of breath)     History reviewed. No pertinent surgical history.  Current Medications: Current Meds  Medication Sig  . allopurinol (ZYLOPRIM) 100 MG tablet Take 100 mg by mouth daily.  . Cholecalciferol (VITAMIN D3) 50 MCG (2000 UT) TABS Take 2,000 Units/day by mouth daily.  Marland Kitchen ELIQUIS 5 MG TABS tablet Take 2.5 mg by mouth 2 (two) times daily.  Marland Kitchen losartan (COZAAR) 25 MG tablet Take 25 mg by mouth daily.  Marland Kitchen  metoprolol succinate (TOPROL-XL) 50 MG 24 hr tablet Take 50 mg by mouth daily.  . rosuvastatin (CRESTOR) 10 MG tablet Take 10 mg by mouth daily.  . vitamin C (ASCORBIC ACID) 500 MG tablet Take 500 mg by mouth daily.     Allergies:   Patient has no known allergies.   Social History   Socioeconomic History  . Marital status: Divorced    Spouse name: Not on file  . Number of children: Not on file  . Years of education: Not on file  .  Highest education level: Not on file  Occupational History  . Not on file  Tobacco Use  . Smoking status: Never Smoker  . Smokeless tobacco: Never Used  Substance and Sexual Activity  . Alcohol use: Not Currently  . Drug use: Not on file  . Sexual activity: Not on file  Other Topics Concern  . Not on file  Social History Narrative  . Not on file   Social Determinants of Health   Financial Resource Strain: Not on file  Food Insecurity: Not on file  Transportation Needs: Not on file  Physical Activity: Not on file  Stress: Not on file  Social Connections: Not on file     Family History: The patient's family history includes Heart attack in her father and mother; Heart disease in her father and mother.  ROS:   Please see the history of present illness.    Difficulty with ambulation and some imbalance.  Uses a rolling walker.  No difficulty sleeping.  Family supports the concept that she has normal cognitive function.  Patient states that she is getting increasingly forgetful compared to what she considers her optimal baseline.  All other systems reviewed and are negative.  EKGs/Labs/Other Studies Reviewed:    The following studies were reviewed today:  2D Doppler echocardiogram October 05, 2020: IMPRESSIONS    1. Left ventricular ejection fraction, by estimation, is 55 to 60%. The  left ventricle has normal function. The left ventricle has no regional  wall motion abnormalities. Left ventricular diastolic function could not  be evaluated.  2. Right ventricular systolic function is normal. The right ventricular  size is normal. There is normal pulmonary artery systolic pressure.  3. The mitral valve is normal in structure. Moderate mitral valve  regurgitation. No evidence of mitral stenosis.  4. The aortic valve is tricuspid. There is moderate calcification of the  aortic valve. There is moderate thickening of the aortic valve. Aortic  valve regurgitation is not  visualized. Severe aortic valve stenosis.  5. Pulmonic valve regurgitation is moderate.   EKG:  EKG not repeated  Recent Labs: 08/15/2020: ALT 16; BUN 16; Creatinine, Ser 0.98; Hemoglobin 14.2; Platelets 227; Potassium 4.5; Sodium 140  Recent Lipid Panel No results found for: CHOL, TRIG, HDL, CHOLHDL, VLDL, LDLCALC, LDLDIRECT  Physical Exam:    VS:  BP 128/82   Pulse 95   Ht 5\' 3"  (1.6 m)   Wt 161 lb 9.6 oz (73.3 kg)   SpO2 100%   BMI 28.63 kg/m     Wt Readings from Last 3 Encounters:  10/14/20 161 lb 9.6 oz (73.3 kg)  08/15/20 153 lb 3.5 oz (69.5 kg)  08/12/20 153 lb 3.2 oz (69.5 kg)     GEN: Elderly with stable weight.  Does not appear frail.. No acute distress HEENT: Normal NECK: No JVD. LYMPHATICS: No lymphadenopathy CARDIAC: 3/6 right upper sternal and subclavicular crescendo decrescendo systolic murmur. IIRR no gallop, with bilateral 1-2+  ankle edema. VASCULAR:  Normal Pulses. No bruits. RESPIRATORY:  Clear to auscultation without rales, wheezing or rhonchi  ABDOMEN: Soft, non-tender, non-distended, No pulsatile mass, MUSCULOSKELETAL: No deformity  SKIN: Warm and dry NEUROLOGIC:  Alert and oriented x 3 PSYCHIATRIC:  Normal affect   ASSESSMENT:    1. Aortic stenosis, severe   2. Coronary artery disease of native artery of native heart with stable angina pectoris (HCC)   3. Chronic atrial fibrillation (HCC)   4. Primary hypertension   5. Type 2 diabetes mellitus with complication, without long-term current use of insulin (HCC)   6. Chronic anticoagulation    PLAN:    In order of problems listed above:  1. By shared decision making, management of severe aortic stenosis will be conservative medical management given the patient's age and also her desires concerning invasive medical interventions at her current age.  I do believe that she was able to participate sufficiently in the conversation to make this decision. 2. Asymptomatic relative to angina.  Known  three-vessel coronary stent implantations in the past. 3. Good rate control. 4. Blood pressure control is adequate for age. 5. We did not discuss diabetes mellitus today. 6. Continue Eliquis 2.5 mg p.o. twice daily.  They are cautioned to keep [place] appraised of symptoms.  We will plan to see her again in 6 months.    Medication Adjustments/Labs and Tests Ordered: Current medicines are reviewed at length with the patient today.  Concerns regarding medicines are outlined above.  No orders of the defined types were placed in this encounter.  No orders of the defined types were placed in this encounter.   Patient Instructions  Medication Instructions:  Your physician recommends that you continue on your current medications as directed. Please refer to the Current Medication list given to you today.  *If you need a refill on your cardiac medications before your next appointment, please call your pharmacy*   Lab Work: None If you have labs (blood work) drawn today and your tests are completely normal, you will receive your results only by: Marland Kitchen MyChart Message (if you have MyChart) OR . A paper copy in the mail If you have any lab test that is abnormal or we need to change your treatment, we will call you to review the results.   Testing/Procedures: None   Follow-Up: At [facility], you and your health needs are our priority.  As part of our continuing mission to provide you with exceptional heart care, we have created designated Provider Care Teams.  These Care Teams include your primary Cardiologist (physician) and Advanced Practice Providers (APPs -  Physician Assistants and Nurse Practitioners) who all work together to provide you with the care you need, when you need it.  We recommend signing up for the patient portal called "MyChart".  Sign up information is provided on this After Visit Summary.  MyChart is used to connect with patients for Virtual Visits (Telemedicine).   Patients are able to view lab/test results, encounter notes, upcoming appointments, etc.  Non-urgent messages can be sent to your provider as well.   To learn more about what you can do with MyChart, go to [email or web address].    Your next appointment:   6 month(s)  The format for your next appointment:   In Person  Provider:   You may see Lesleigh Noe, MD or one of the following Advanced Practice Providers on your designated Care Team:    Georgie Chard, NP  Other Instructions      Signed, Lesleigh NoeHenry W Davieon Stockham III, MD  10/14/2020 11:39 AM    [facility]

## 2020-10-14 ENCOUNTER — Other Ambulatory Visit: Payer: Self-pay

## 2020-10-14 ENCOUNTER — Encounter: Payer: Self-pay | Admitting: Interventional Cardiology

## 2020-10-14 ENCOUNTER — Ambulatory Visit (INDEPENDENT_AMBULATORY_CARE_PROVIDER_SITE_OTHER): Payer: Medicare Other | Admitting: Interventional Cardiology

## 2020-10-14 VITALS — BP 128/82 | HR 95 | Ht 63.0 in | Wt 161.6 lb

## 2020-10-14 DIAGNOSIS — E118 Type 2 diabetes mellitus with unspecified complications: Secondary | ICD-10-CM

## 2020-10-14 DIAGNOSIS — I10 Essential (primary) hypertension: Secondary | ICD-10-CM | POA: Diagnosis not present

## 2020-10-14 DIAGNOSIS — I482 Chronic atrial fibrillation, unspecified: Secondary | ICD-10-CM | POA: Diagnosis not present

## 2020-10-14 DIAGNOSIS — I25118 Atherosclerotic heart disease of native coronary artery with other forms of angina pectoris: Secondary | ICD-10-CM | POA: Diagnosis not present

## 2020-10-14 DIAGNOSIS — I350 Nonrheumatic aortic (valve) stenosis: Secondary | ICD-10-CM | POA: Diagnosis not present

## 2020-10-14 DIAGNOSIS — Z7901 Long term (current) use of anticoagulants: Secondary | ICD-10-CM

## 2020-10-14 DIAGNOSIS — Z66 Do not resuscitate: Secondary | ICD-10-CM

## 2020-10-14 NOTE — Patient Instructions (Signed)

## 2020-11-23 ENCOUNTER — Other Ambulatory Visit: Payer: Self-pay | Admitting: *Deleted

## 2020-11-23 MED ORDER — ALLOPURINOL 100 MG PO TABS: 100.0000 mg | ORAL_TABLET | Freq: Every day | ORAL | 3 refills | Status: DC

## 2020-11-23 MED ORDER — ROSUVASTATIN CALCIUM 10 MG PO TABS: 10.0000 mg | ORAL_TABLET | Freq: Every day | ORAL | 3 refills | Status: DC

## 2021-01-17 DIAGNOSIS — Z20828 Contact with and (suspected) exposure to other viral communicable diseases: Secondary | ICD-10-CM | POA: Diagnosis not present

## 2021-01-17 DIAGNOSIS — Z1159 Encounter for screening for other viral diseases: Secondary | ICD-10-CM | POA: Diagnosis not present

## 2021-01-24 ENCOUNTER — Other Ambulatory Visit: Payer: Self-pay

## 2021-01-24 DIAGNOSIS — Z20828 Contact with and (suspected) exposure to other viral communicable diseases: Secondary | ICD-10-CM | POA: Diagnosis not present

## 2021-01-24 MED ORDER — LOSARTAN POTASSIUM 25 MG PO TABS: 25.0000 mg | ORAL_TABLET | Freq: Every day | ORAL | 2 refills | Status: DC

## 2021-01-24 MED ORDER — METOPROLOL SUCCINATE ER 50 MG PO TB24: 50.0000 mg | ORAL_TABLET | Freq: Every day | ORAL | 2 refills | Status: DC

## 2021-02-14 DIAGNOSIS — Z20828 Contact with and (suspected) exposure to other viral communicable diseases: Secondary | ICD-10-CM | POA: Diagnosis not present

## 2021-02-17 DIAGNOSIS — R7309 Other abnormal glucose: Secondary | ICD-10-CM | POA: Diagnosis not present

## 2021-03-03 DIAGNOSIS — R2681 Unsteadiness on feet: Secondary | ICD-10-CM | POA: Diagnosis not present

## 2021-03-03 DIAGNOSIS — R2689 Other abnormalities of gait and mobility: Secondary | ICD-10-CM | POA: Diagnosis not present

## 2021-03-03 DIAGNOSIS — M6281 Muscle weakness (generalized): Secondary | ICD-10-CM | POA: Diagnosis not present

## 2021-03-07 DIAGNOSIS — Z20828 Contact with and (suspected) exposure to other viral communicable diseases: Secondary | ICD-10-CM | POA: Diagnosis not present

## 2021-03-09 DIAGNOSIS — R2681 Unsteadiness on feet: Secondary | ICD-10-CM | POA: Diagnosis not present

## 2021-03-09 DIAGNOSIS — R2689 Other abnormalities of gait and mobility: Secondary | ICD-10-CM | POA: Diagnosis not present

## 2021-03-09 DIAGNOSIS — M6281 Muscle weakness (generalized): Secondary | ICD-10-CM | POA: Diagnosis not present

## 2021-03-11 DIAGNOSIS — R2681 Unsteadiness on feet: Secondary | ICD-10-CM | POA: Diagnosis not present

## 2021-03-11 DIAGNOSIS — R2689 Other abnormalities of gait and mobility: Secondary | ICD-10-CM | POA: Diagnosis not present

## 2021-03-11 DIAGNOSIS — M6281 Muscle weakness (generalized): Secondary | ICD-10-CM | POA: Diagnosis not present

## 2021-03-15 DIAGNOSIS — R2689 Other abnormalities of gait and mobility: Secondary | ICD-10-CM | POA: Diagnosis not present

## 2021-03-15 DIAGNOSIS — M6281 Muscle weakness (generalized): Secondary | ICD-10-CM | POA: Diagnosis not present

## 2021-03-15 DIAGNOSIS — R2681 Unsteadiness on feet: Secondary | ICD-10-CM | POA: Diagnosis not present

## 2021-03-17 DIAGNOSIS — R2689 Other abnormalities of gait and mobility: Secondary | ICD-10-CM | POA: Diagnosis not present

## 2021-03-17 DIAGNOSIS — M6281 Muscle weakness (generalized): Secondary | ICD-10-CM | POA: Diagnosis not present

## 2021-03-17 DIAGNOSIS — R2681 Unsteadiness on feet: Secondary | ICD-10-CM | POA: Diagnosis not present

## 2021-03-21 DIAGNOSIS — R2681 Unsteadiness on feet: Secondary | ICD-10-CM | POA: Diagnosis not present

## 2021-03-21 DIAGNOSIS — R2689 Other abnormalities of gait and mobility: Secondary | ICD-10-CM | POA: Diagnosis not present

## 2021-03-21 DIAGNOSIS — M6281 Muscle weakness (generalized): Secondary | ICD-10-CM | POA: Diagnosis not present

## 2021-03-23 DIAGNOSIS — R2681 Unsteadiness on feet: Secondary | ICD-10-CM | POA: Diagnosis not present

## 2021-03-23 DIAGNOSIS — M6281 Muscle weakness (generalized): Secondary | ICD-10-CM | POA: Diagnosis not present

## 2021-03-23 DIAGNOSIS — R2689 Other abnormalities of gait and mobility: Secondary | ICD-10-CM | POA: Diagnosis not present

## 2021-03-29 DIAGNOSIS — R2681 Unsteadiness on feet: Secondary | ICD-10-CM | POA: Diagnosis not present

## 2021-03-29 DIAGNOSIS — R2689 Other abnormalities of gait and mobility: Secondary | ICD-10-CM | POA: Diagnosis not present

## 2021-03-29 DIAGNOSIS — M6281 Muscle weakness (generalized): Secondary | ICD-10-CM | POA: Diagnosis not present

## 2021-04-01 DIAGNOSIS — M6281 Muscle weakness (generalized): Secondary | ICD-10-CM | POA: Diagnosis not present

## 2021-04-01 DIAGNOSIS — R2681 Unsteadiness on feet: Secondary | ICD-10-CM | POA: Diagnosis not present

## 2021-04-01 DIAGNOSIS — R2689 Other abnormalities of gait and mobility: Secondary | ICD-10-CM | POA: Diagnosis not present

## 2021-04-04 DIAGNOSIS — R2681 Unsteadiness on feet: Secondary | ICD-10-CM | POA: Diagnosis not present

## 2021-04-04 DIAGNOSIS — M6281 Muscle weakness (generalized): Secondary | ICD-10-CM | POA: Diagnosis not present

## 2021-04-04 DIAGNOSIS — R2689 Other abnormalities of gait and mobility: Secondary | ICD-10-CM | POA: Diagnosis not present

## 2021-04-06 DIAGNOSIS — R2681 Unsteadiness on feet: Secondary | ICD-10-CM | POA: Diagnosis not present

## 2021-04-06 DIAGNOSIS — R2689 Other abnormalities of gait and mobility: Secondary | ICD-10-CM | POA: Diagnosis not present

## 2021-04-06 DIAGNOSIS — M6281 Muscle weakness (generalized): Secondary | ICD-10-CM | POA: Diagnosis not present

## 2021-04-07 DIAGNOSIS — E7849 Other hyperlipidemia: Secondary | ICD-10-CM | POA: Diagnosis not present

## 2021-04-07 DIAGNOSIS — I4891 Unspecified atrial fibrillation: Secondary | ICD-10-CM | POA: Diagnosis not present

## 2021-04-07 DIAGNOSIS — N1831 Chronic kidney disease, stage 3a: Secondary | ICD-10-CM | POA: Diagnosis not present

## 2021-04-07 DIAGNOSIS — I129 Hypertensive chronic kidney disease with stage 1 through stage 4 chronic kidney disease, or unspecified chronic kidney disease: Secondary | ICD-10-CM | POA: Diagnosis not present

## 2021-04-12 DIAGNOSIS — R2689 Other abnormalities of gait and mobility: Secondary | ICD-10-CM | POA: Diagnosis not present

## 2021-04-12 DIAGNOSIS — R2681 Unsteadiness on feet: Secondary | ICD-10-CM | POA: Diagnosis not present

## 2021-04-12 DIAGNOSIS — M6281 Muscle weakness (generalized): Secondary | ICD-10-CM | POA: Diagnosis not present

## 2021-04-13 DIAGNOSIS — M6281 Muscle weakness (generalized): Secondary | ICD-10-CM | POA: Diagnosis not present

## 2021-04-13 DIAGNOSIS — R2681 Unsteadiness on feet: Secondary | ICD-10-CM | POA: Diagnosis not present

## 2021-04-13 DIAGNOSIS — R2689 Other abnormalities of gait and mobility: Secondary | ICD-10-CM | POA: Diagnosis not present

## 2021-04-18 DIAGNOSIS — R2681 Unsteadiness on feet: Secondary | ICD-10-CM | POA: Diagnosis not present

## 2021-04-18 DIAGNOSIS — M6281 Muscle weakness (generalized): Secondary | ICD-10-CM | POA: Diagnosis not present

## 2021-04-18 DIAGNOSIS — R2689 Other abnormalities of gait and mobility: Secondary | ICD-10-CM | POA: Diagnosis not present

## 2021-04-20 ENCOUNTER — Encounter: Payer: Self-pay | Admitting: *Deleted

## 2021-04-20 DIAGNOSIS — M6281 Muscle weakness (generalized): Secondary | ICD-10-CM | POA: Diagnosis not present

## 2021-04-20 DIAGNOSIS — R2681 Unsteadiness on feet: Secondary | ICD-10-CM | POA: Diagnosis not present

## 2021-04-20 DIAGNOSIS — R2689 Other abnormalities of gait and mobility: Secondary | ICD-10-CM | POA: Diagnosis not present

## 2021-04-21 DIAGNOSIS — I350 Nonrheumatic aortic (valve) stenosis: Secondary | ICD-10-CM | POA: Insufficient documentation

## 2021-04-21 DIAGNOSIS — I25118 Atherosclerotic heart disease of native coronary artery with other forms of angina pectoris: Secondary | ICD-10-CM | POA: Insufficient documentation

## 2021-04-21 DIAGNOSIS — I4821 Permanent atrial fibrillation: Secondary | ICD-10-CM | POA: Insufficient documentation

## 2021-04-21 DIAGNOSIS — I10 Essential (primary) hypertension: Secondary | ICD-10-CM | POA: Insufficient documentation

## 2021-04-21 NOTE — Progress Notes (Signed)
Cardiology Office Note:    Date:  04/22/2021   ID:  Olivia Erickson, DOB May 20, 1924, MRN [id number]  PCP:  Roetta Sessions, NP   [place] Providers Cardiologist:  Sinclair Grooms, MD     Referring MD: No ref. provider found   Chief Complaint:  Follow-up for CAD, aortic stenosis, atrial fibrillation    Patient Profile:   Olivia Erickson is a 87 y.o. female with:  Permanent atrial fibrillation Severe aortic stenosis Conservative management  Coronary artery disease S/p stent to the proximal RCA, mid LAD, LCx in 2019 Diabetes mellitus Hyperlipidemia Hypertension Chronic kidney disease Chronic anticoagulation DNR   History of Present Illness: Olivia Erickson was last seen by Dr. Tamala Julian in 4/22.  Decision at that visit was to manage her aortic stenosis conservatively.  She returns for f/u.   She is here with her daughter, Rip Harbour.  Rip Harbour and I worked together at [employer].  Olivia Erickson is doing well.  She lives at [facility].  She does PT.  She uses a walker.  She does get short of breath with minimal activity.  This does not seem to bother her much.  Her daughter has not noticed any significant worsening.  She has not had orthopnea, chest pain, syncope.  She does have some leg edema without change.  ASSESSMENT & PLAN:   Coronary artery disease of native artery of native heart with stable angina pectoris ([place]) History of stenting to the RCA, LAD and LCx in 2019 while living in [place].  She seems to be doing well without anginal symptoms on her current medical regimen which includes metoprolol succinate, rosuvastatin.  Follow-up in 6 months.  Permanent atrial fibrillation (HCC) Rate seems to be controlled.  She has been continued on low-dose Apixaban (2.5 mg twice daily) to reduce bleeding risk.  Labs in February demonstrated normal creatinine, hemoglobin.  Aortic stenosis, severe She has opted for conservative management.  She is DNR.  She has not had any  significant worsening in her symptomatology since last visit.  Essential hypertension Blood pressure is controlled on current regimen which includes losartan, metoprolol succinate.            Dispo:  Return in about 6 months (around 10/20/2021) for Routine follow up in 6 months with Dr.Smith. .    Prior CV studies: Echocardiogram 10/05/2020 EF 55-60, no RWMA, normal RVSF, moderate MR, severe aortic stenosis (mean gradient 45 mmHg, V-max 414 cm/s, DI 0.11), moderate PI, RVSP 32.2     Past Medical History:  Diagnosis Date   A-fib (HCC)    Aortic stenosis    SOB (shortness of breath)    Current Medications: Current Meds  Medication Sig   allopurinol (ZYLOPRIM) 100 MG tablet Take 1 tablet (100 mg total) by mouth daily.   Cholecalciferol (VITAMIN D3) 50 MCG (2000 UT) TABS Take 2,000 Units/day by mouth daily.   ELIQUIS 5 MG TABS tablet Take 2.5 mg by mouth 2 (two) times daily.   losartan (COZAAR) 25 MG tablet Take 1 tablet (25 mg total) by mouth daily.   metoprolol succinate (TOPROL-XL) 50 MG 24 hr tablet Take 1 tablet (50 mg total) by mouth daily.   rosuvastatin (CRESTOR) 10 MG tablet Take 1 tablet (10 mg total) by mouth daily.   vitamin C (ASCORBIC ACID) 500 MG tablet Take 500 mg by mouth daily.    Allergies:   Patient has no known allergies.   Social History   Tobacco Use   Smoking  status: Never   Smokeless tobacco: Never  Substance Use Topics   Alcohol use: Not Currently   Drug use: Never    Family Hx: The patient's family history includes Heart attack in her father and mother; Heart disease in her father and mother.  ROS   EKGs/Labs/Other Test Reviewed:    EKG:  EKG is not ordered today.  The ekg ordered today demonstrates n/a  Recent Labs: 08/15/2020: ALT 16; BUN 16; Creatinine, Ser 0.98; Hemoglobin 14.2; Platelets 227; Potassium 4.5; Sodium 140   Recent Lipid Panel No results found for: CHOL, TRIG, HDL, LDLCALC, LDLDIRECT   Risk Assessment/Calculations:     CHA2DS2-VASc Score = 6   This indicates a 9.7% annual risk of stroke. The patient's score is based upon: CHF History: 0 HTN History: 1 Diabetes History: 1 Stroke History: 0 Vascular Disease History: 1 Age Score: 2 Gender Score: 1        Physical Exam:    VS:  BP 130/64   Pulse 86   Ht 5\' 3"  (1.6 m)   Wt 160 lb 9.6 oz (72.8 kg)   SpO2 98%   BMI 28.45 kg/m     Wt Readings from Last 3 Encounters:  04/22/21 160 lb 9.6 oz (72.8 kg)  10/14/20 161 lb 9.6 oz (73.3 kg)  08/15/20 153 lb 3.5 oz (69.5 kg)    Constitutional:      Appearance: Healthy appearance. Not in distress.  Neck:     Vascular: No JVR.  Pulmonary:     Effort: Pulmonary effort is normal.     Breath sounds: No wheezing. No rales.  Cardiovascular:     Normal rate. Irregularly irregular rhythm. Normal S1. Normal S2.      Murmurs: There is a grade 3/6 crescendo-decrescendo systolic murmur at the URSB and ULSB.  Edema:    Peripheral edema present.    Ankle: bilateral 1+ edema of the ankle. Abdominal:     Palpations: Abdomen is soft.  Skin:    General: Skin is warm and dry.  Neurological:     Mental Status: Alert and oriented to person, place and time.     Cranial Nerves: Cranial nerves are intact.        Medication Adjustments/Labs and Tests Ordered: Current medicines are reviewed at length with the patient today.  Concerns regarding medicines are outlined above.  Tests Ordered: No orders of the defined types were placed in this encounter.  Medication Changes: No orders of the defined types were placed in this encounter.  Signed, 08/17/20, PA-C  04/22/2021 11:08 AM    [facility] [address] [place], [place], [place]  [place] Phone: [phone]; Fax: [phone]

## 2021-04-22 ENCOUNTER — Other Ambulatory Visit: Payer: Self-pay

## 2021-04-22 ENCOUNTER — Encounter: Payer: Self-pay | Admitting: Physician Assistant

## 2021-04-22 ENCOUNTER — Ambulatory Visit: Payer: Medicare Other | Admitting: Physician Assistant

## 2021-04-22 VITALS — BP 130/64 | HR 86 | Ht 63.0 in | Wt 160.6 lb

## 2021-04-22 DIAGNOSIS — I10 Essential (primary) hypertension: Secondary | ICD-10-CM | POA: Diagnosis not present

## 2021-04-22 DIAGNOSIS — I350 Nonrheumatic aortic (valve) stenosis: Secondary | ICD-10-CM | POA: Diagnosis not present

## 2021-04-22 DIAGNOSIS — I4821 Permanent atrial fibrillation: Secondary | ICD-10-CM

## 2021-04-22 DIAGNOSIS — I25118 Atherosclerotic heart disease of native coronary artery with other forms of angina pectoris: Secondary | ICD-10-CM | POA: Diagnosis not present

## 2021-04-22 NOTE — Assessment & Plan Note (Signed)
Blood pressure is controlled on current regimen which includes losartan, metoprolol succinate.

## 2021-04-22 NOTE — Assessment & Plan Note (Signed)
Rate seems to be controlled.  She has been continued on low-dose Apixaban (2.5 mg twice daily) to reduce bleeding risk.  Labs in February demonstrated normal creatinine, hemoglobin.

## 2021-04-22 NOTE — Assessment & Plan Note (Signed)
History of stenting to the RCA, LAD and LCx in 2019 while living in [place].  She seems to be doing well without anginal symptoms on her current medical regimen which includes metoprolol succinate, rosuvastatin.  Follow-up in 6 months.

## 2021-04-22 NOTE — Patient Instructions (Signed)
Medication Instructions:   Your physician recommends that you continue on your current medications as directed. Please refer to the Current Medication list given to you today.  *If you need a refill on your cardiac medications before your next appointment, please call your pharmacy*   Lab Work:  -NONE  If you have labs (blood work) drawn today and your tests are completely normal, you will receive your results only by: MyChart Message (if you have MyChart) OR A paper copy in the mail If you have any lab test that is abnormal or we need to change your treatment, we will call you to review the results.   Testing/Procedures:  -NONE   Follow-Up: At [facility], you and your health needs are our priority.  As part of our continuing mission to provide you with exceptional heart care, we have created designated Provider Care Teams.  These Care Teams include your primary Cardiologist (physician) and Advanced Practice Providers (APPs -  Physician Assistants and Nurse Practitioners) who all work together to provide you with the care you need, when you need it.  We recommend signing up for the patient portal called "MyChart".  Sign up information is provided on this After Visit Summary.  MyChart is used to connect with patients for Virtual Visits (Telemedicine).  Patients are able to view lab/test results, encounter notes, upcoming appointments, etc.  Non-urgent messages can be sent to your provider as well.   To learn more about what you can do with MyChart, go to [email or web address].[email or web address].    Your next appointment:   6 month(s)  The format for your next appointment:   In Person  Provider:   Olivia W Smith III, MD     Other Instructions  Your physician wants you to follow-up in: 6 months with Dr. Smith.  You will receive a reminder letter in the mail two months in advance. If you don't receive a letter, please call our office to schedule the follow-up appointment.   

## 2021-04-22 NOTE — Assessment & Plan Note (Signed)
She has opted for conservative management.  She is DNR.  She has not had any significant worsening in her symptomatology since last visit.

## 2021-04-25 DIAGNOSIS — M6281 Muscle weakness (generalized): Secondary | ICD-10-CM | POA: Diagnosis not present

## 2021-04-25 DIAGNOSIS — R2689 Other abnormalities of gait and mobility: Secondary | ICD-10-CM | POA: Diagnosis not present

## 2021-04-25 DIAGNOSIS — R2681 Unsteadiness on feet: Secondary | ICD-10-CM | POA: Diagnosis not present

## 2021-04-27 DIAGNOSIS — R2689 Other abnormalities of gait and mobility: Secondary | ICD-10-CM | POA: Diagnosis not present

## 2021-04-27 DIAGNOSIS — M6281 Muscle weakness (generalized): Secondary | ICD-10-CM | POA: Diagnosis not present

## 2021-04-27 DIAGNOSIS — R2681 Unsteadiness on feet: Secondary | ICD-10-CM | POA: Diagnosis not present

## 2021-05-02 DIAGNOSIS — R2681 Unsteadiness on feet: Secondary | ICD-10-CM | POA: Diagnosis not present

## 2021-05-02 DIAGNOSIS — Z20828 Contact with and (suspected) exposure to other viral communicable diseases: Secondary | ICD-10-CM | POA: Diagnosis not present

## 2021-05-02 DIAGNOSIS — M6281 Muscle weakness (generalized): Secondary | ICD-10-CM | POA: Diagnosis not present

## 2021-05-02 DIAGNOSIS — R2689 Other abnormalities of gait and mobility: Secondary | ICD-10-CM | POA: Diagnosis not present

## 2021-05-05 DIAGNOSIS — R2689 Other abnormalities of gait and mobility: Secondary | ICD-10-CM | POA: Diagnosis not present

## 2021-05-05 DIAGNOSIS — R2681 Unsteadiness on feet: Secondary | ICD-10-CM | POA: Diagnosis not present

## 2021-05-05 DIAGNOSIS — M6281 Muscle weakness (generalized): Secondary | ICD-10-CM | POA: Diagnosis not present

## 2021-05-09 DIAGNOSIS — R2681 Unsteadiness on feet: Secondary | ICD-10-CM | POA: Diagnosis not present

## 2021-05-09 DIAGNOSIS — R2689 Other abnormalities of gait and mobility: Secondary | ICD-10-CM | POA: Diagnosis not present

## 2021-05-09 DIAGNOSIS — Z20822 Contact with and (suspected) exposure to covid-19: Secondary | ICD-10-CM | POA: Diagnosis not present

## 2021-05-09 DIAGNOSIS — M6281 Muscle weakness (generalized): Secondary | ICD-10-CM | POA: Diagnosis not present

## 2021-05-30 DIAGNOSIS — Z1159 Encounter for screening for other viral diseases: Secondary | ICD-10-CM | POA: Diagnosis not present

## 2021-05-30 DIAGNOSIS — Z20828 Contact with and (suspected) exposure to other viral communicable diseases: Secondary | ICD-10-CM | POA: Diagnosis not present

## 2021-06-03 DIAGNOSIS — Z1159 Encounter for screening for other viral diseases: Secondary | ICD-10-CM | POA: Diagnosis not present

## 2021-06-03 DIAGNOSIS — Z20828 Contact with and (suspected) exposure to other viral communicable diseases: Secondary | ICD-10-CM | POA: Diagnosis not present

## 2021-06-05 IMAGING — CT CT HEAD W/O CM
4 series · 17 of 47 positions shown, 19 images · non-contrast
Comparison: None.

CLINICAL DATA: Fall

EXAM:
CT HEAD WITHOUT CONTRAST
TECHNIQUE: Contiguous axial images were obtained from the base of the skull
through the vertex without intravenous contrast.

[Series 3: head without · axial · non-contrast · 0.41mm/px · z∈[-120,-10]mm · 6 of 32 slices shown, 8 images]
[im 5/32  brain]
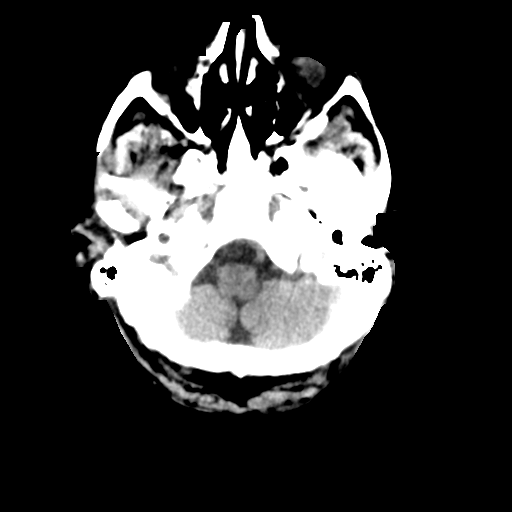
[im 5/32  bone]
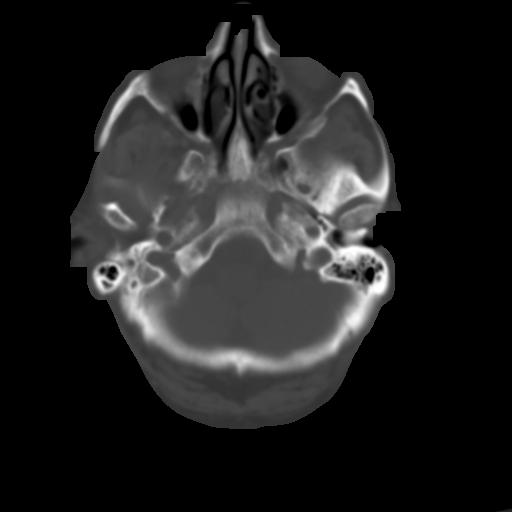
[im 9/32  brain]
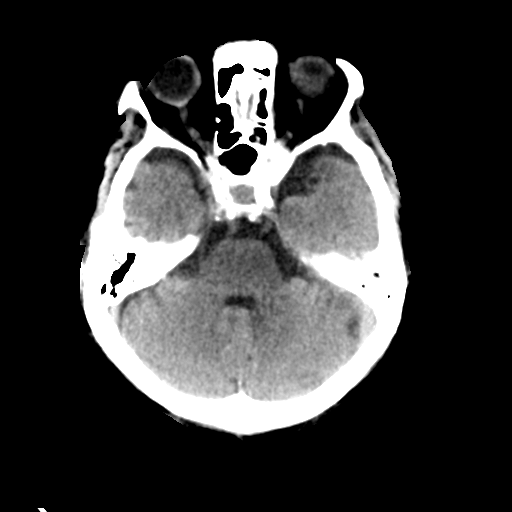
[im 14/32  brain]
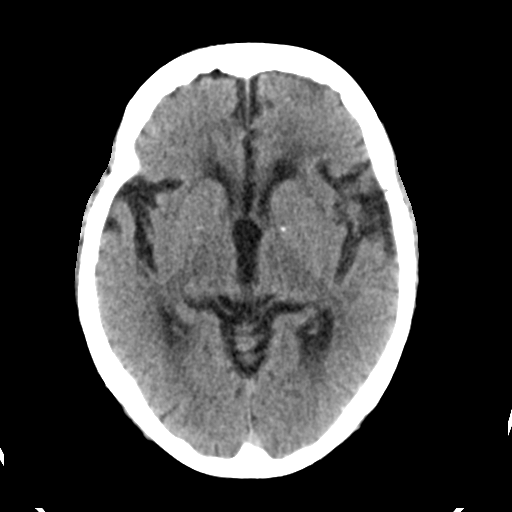
[im 18/32  brain]
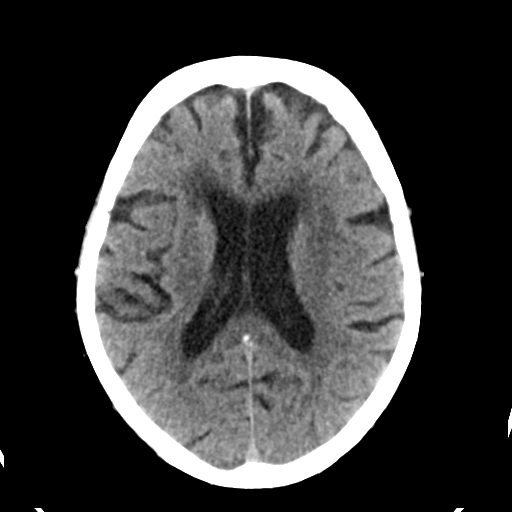
[im 23/32  brain]
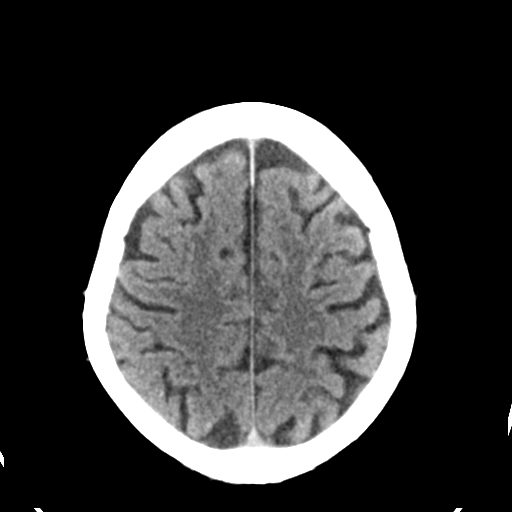
[im 23/32  bone]
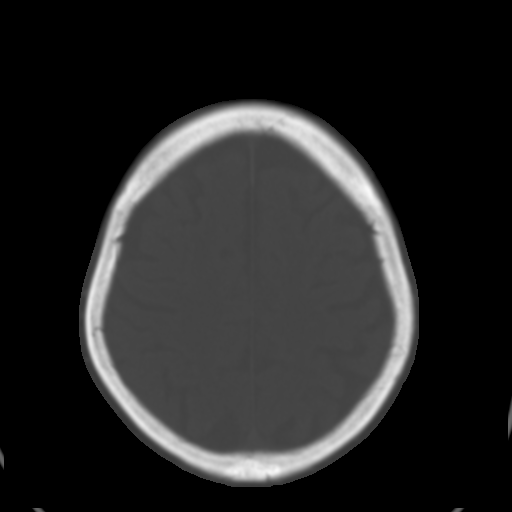
[im 27/32  brain]
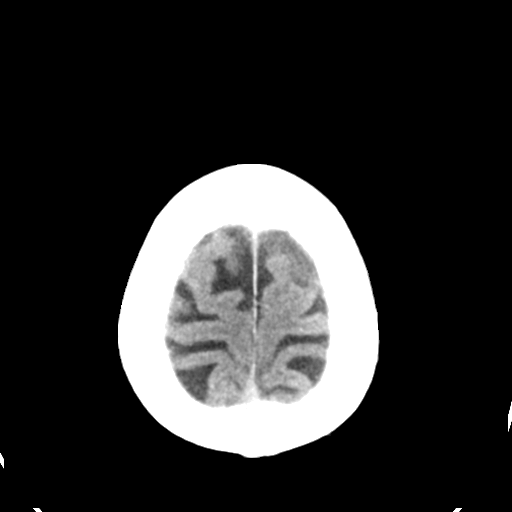

[Series 4: head bone · axial · 0.41mm/px · z∈[-126,-48]mm · 5 of 82 slices shown]
[im 8/82  bone]
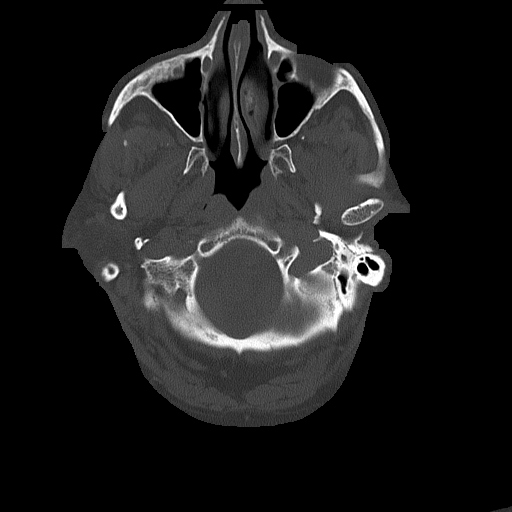
[im 16/82  bone]
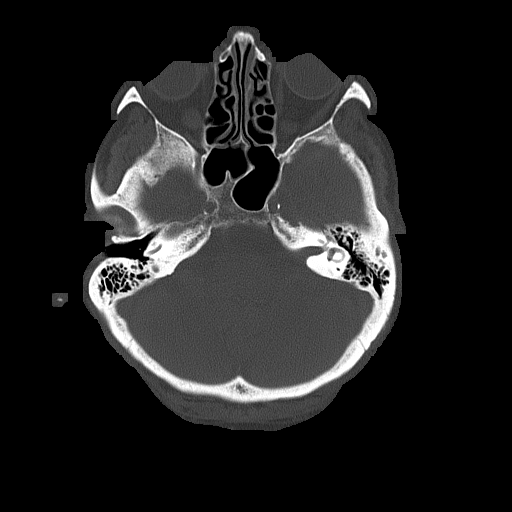
[im 28/82  bone]
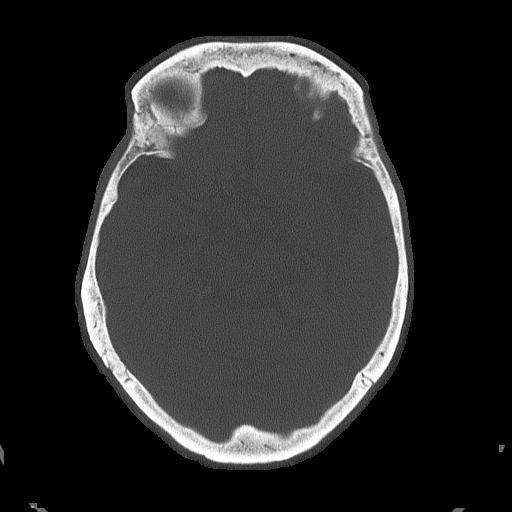
[im 35/82  bone]
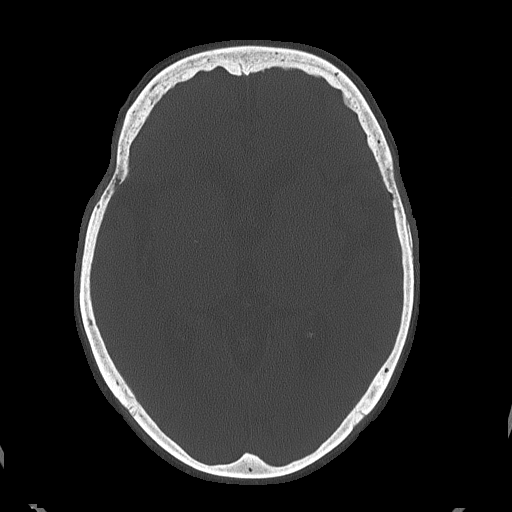
[im 47/82  bone]
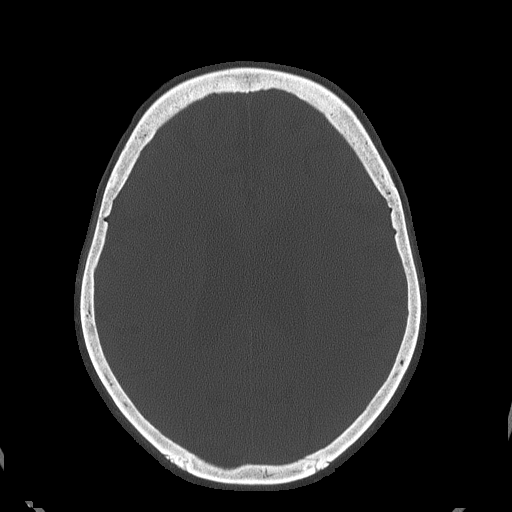

[Series 5: head without cor · coronal · non-contrast · 0.31mm/px · 3 of 67 slices shown]
[im 23/67  brain]
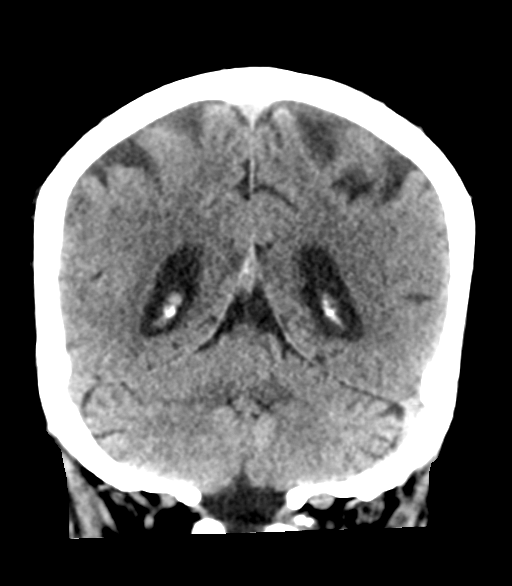
[im 30/67  brain]
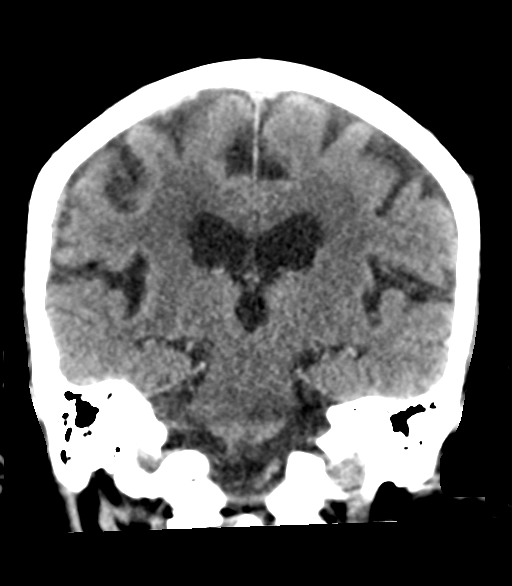
[im 37/67  brain]
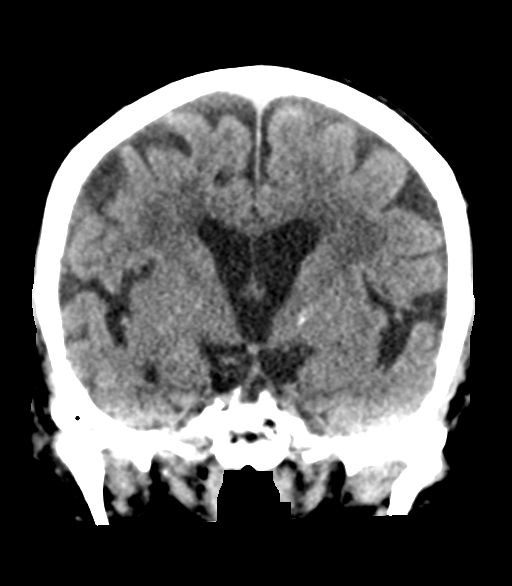

[Series 6: head without sag · sagittal · non-contrast · 0.36mm/px · 3 of 54 slices shown]
[im 18/54  brain]
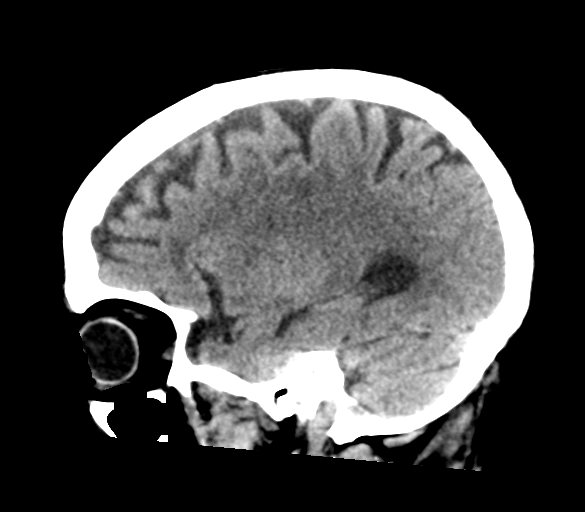
[im 27/54  brain]
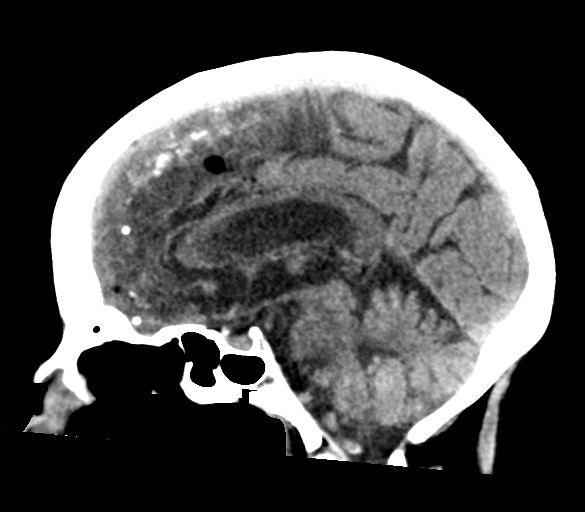
[im 36/54  brain]
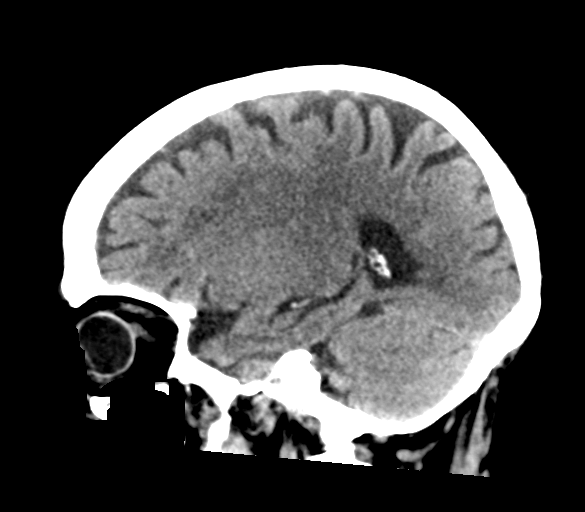

[17 of 47 positions shown; findings below may reference images not displayed]

FINDINGS: Brain: No evidence of acute infarction, hemorrhage, hydrocephalus,
extra-axial collection or mass lesion/mass effect. Global
parenchymal volume loss. Periventricular white matter hypodensities
consistent with sequela of chronic microvascular ischemic disease.

Vascular: Vascular calcifications of the carotid siphons.

Skull: Normal. Negative for fracture or focal lesion.

Sinuses/Orbits: No acute finding.

Other: None.
IMPRESSION: 1.  No acute intracranial abnormality.

## 2021-06-05 IMAGING — CT CT CERVICAL SPINE W/O CM
3 of 4 series · 11 of 33 positions shown, 13 images · non-contrast
Comparison: None.

CLINICAL DATA: Fall

EXAM:
CT CERVICAL SPINE WITHOUT CONTRAST
TECHNIQUE: Multidetector CT imaging of the cervical spine was performed without
intravenous contrast. Multiplanar CT image reconstructions were also
generated.

[Series 4: c_spine 2.0 st · axial · 0.26mm/px · z∈[-274,-162]mm · 3 of 84 slices shown, 4 images]
[im 14/84  soft-tissue]
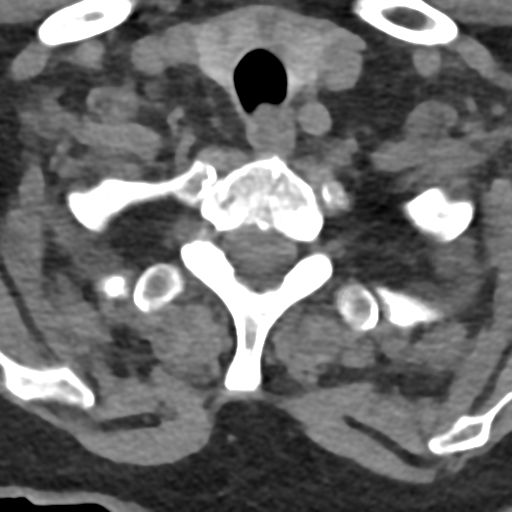
[im 14/84  bone]
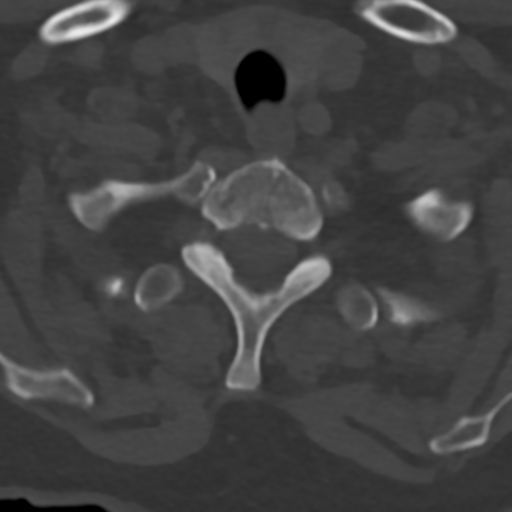
[im 42/84  bone]
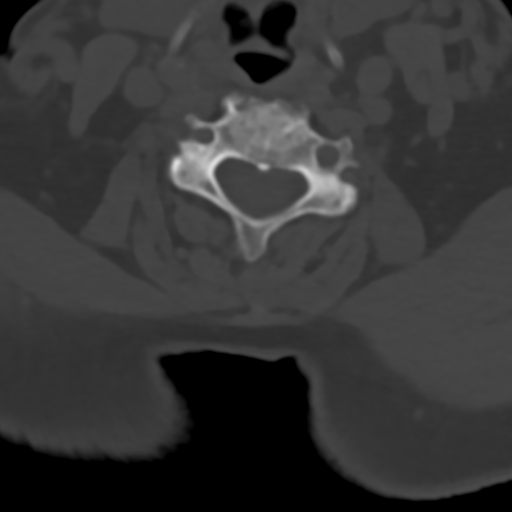
[im 70/84  bone]
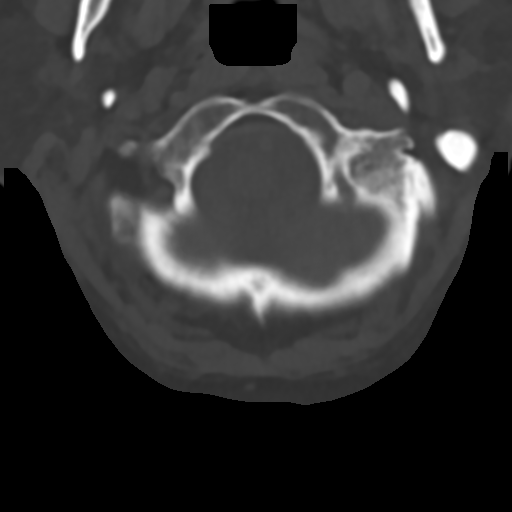

[Series 9: c_spine 2.0 sag bone · sagittal · 0.25mm/px · 5 of 61 slices shown, 6 images]
[im 21/61  bone]
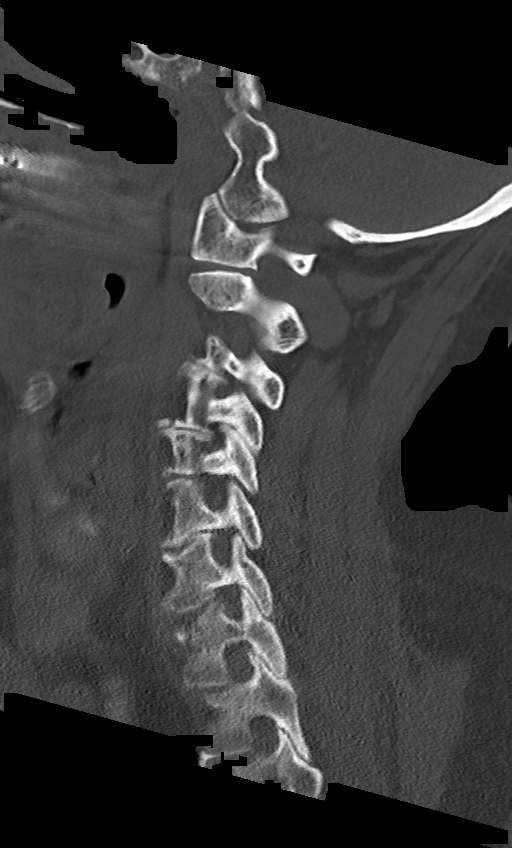
[im 26/61  bone]
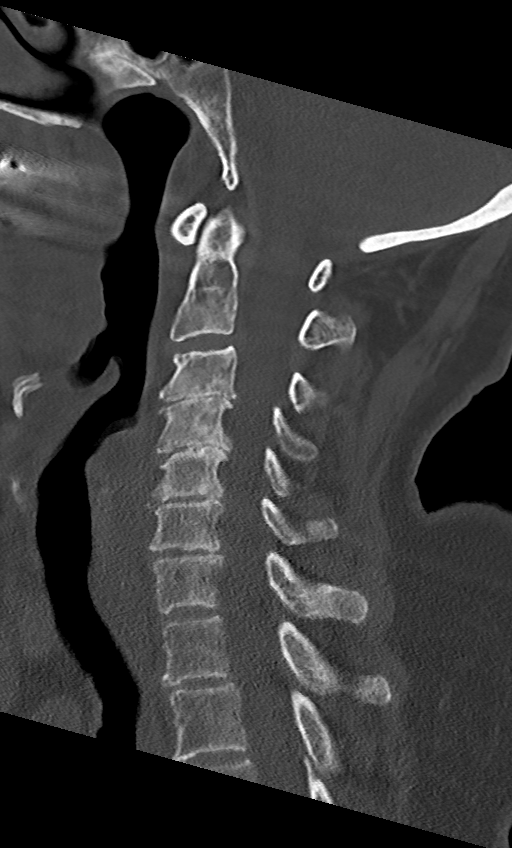
[im 31/61  soft-tissue]
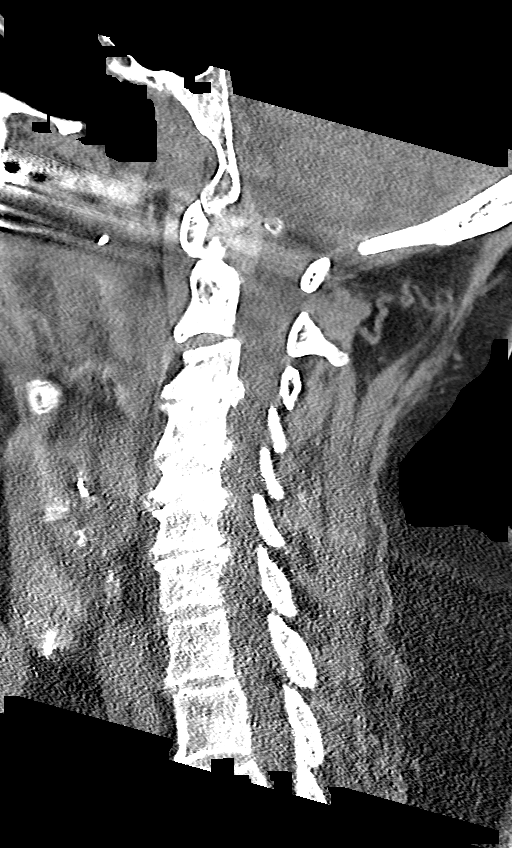
[im 31/61  bone]
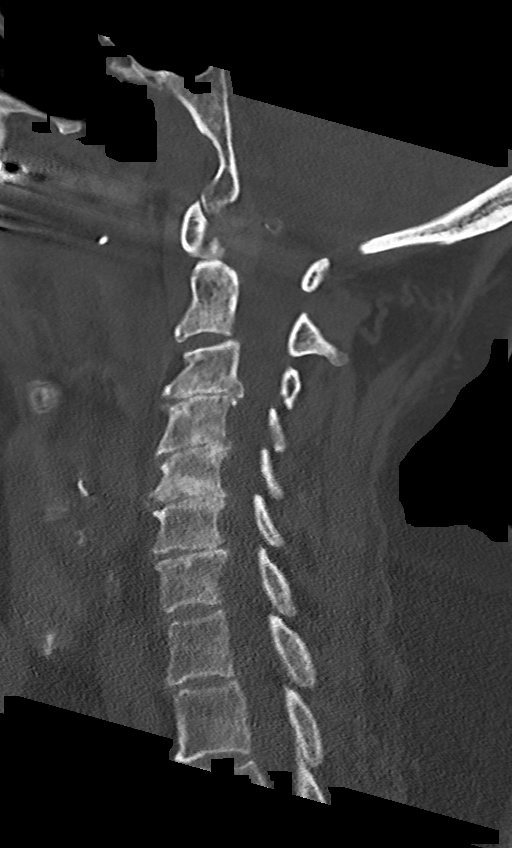
[im 36/61  bone]
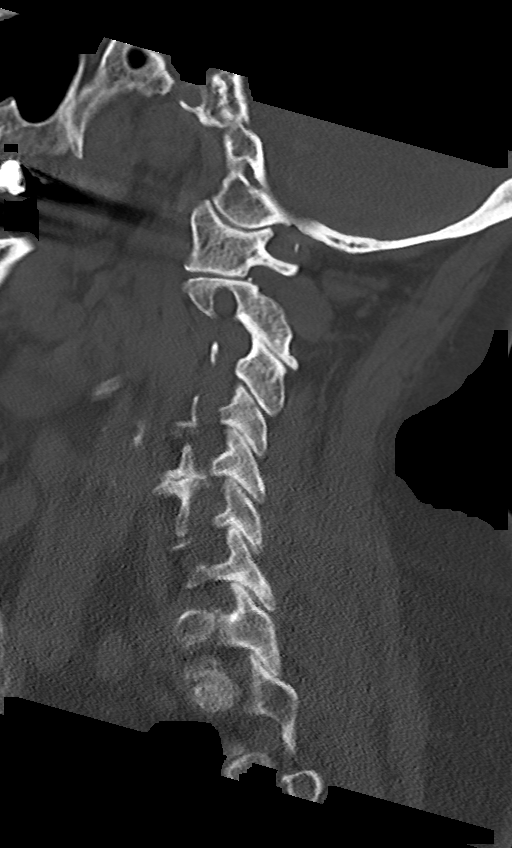
[im 41/61  bone]
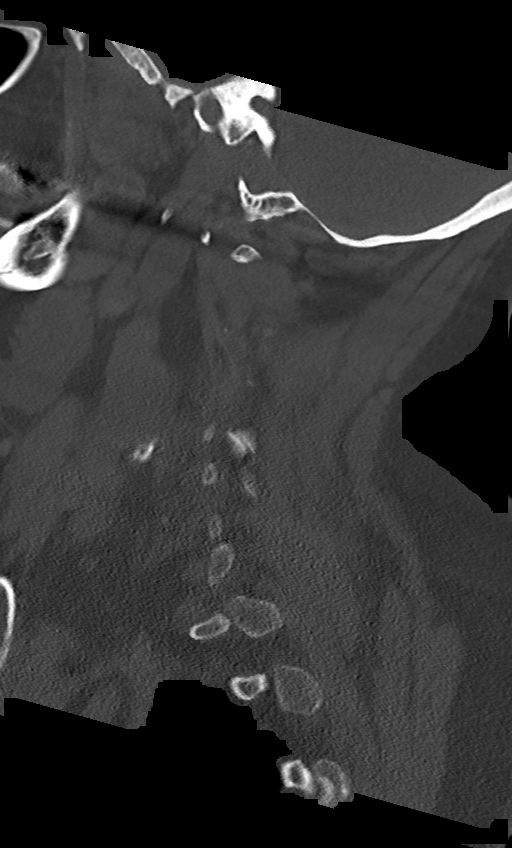

[Series 10: c_spine 2.0 cor bone · coronal · 0.23mm/px · 3 of 65 slices shown]
[im 13/65  bone]
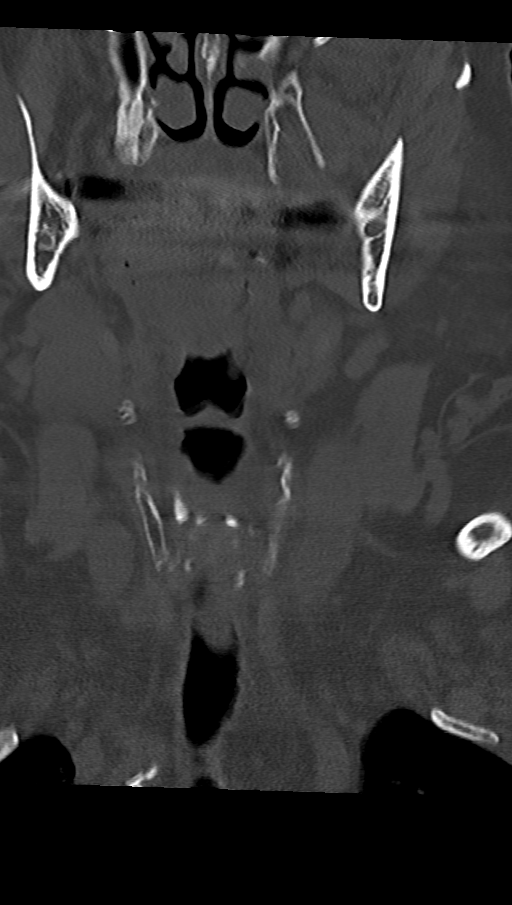
[im 26/65  bone]
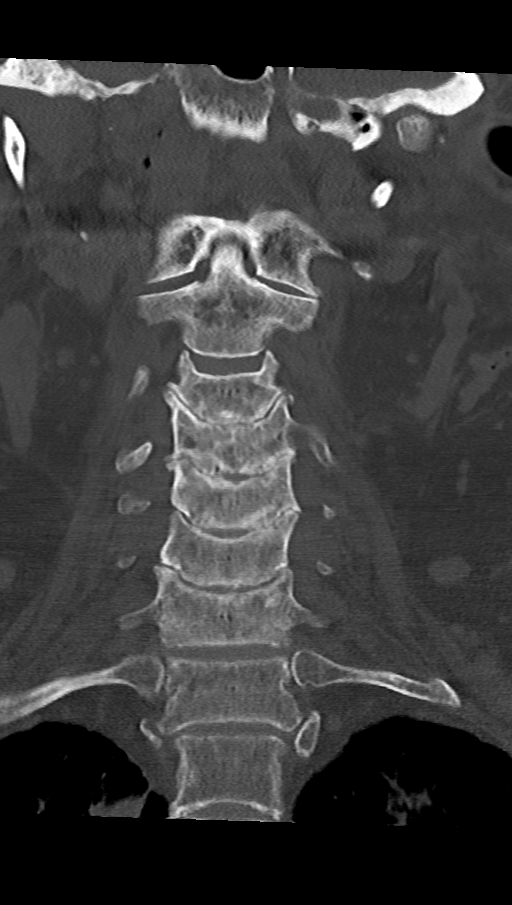
[im 39/65  bone]
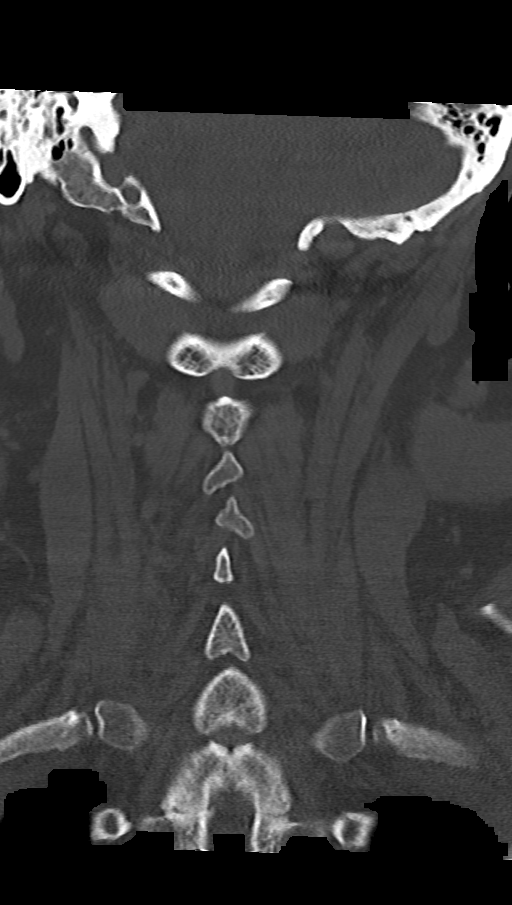

[11 of 33 positions shown; findings below may reference images not displayed]

FINDINGS: Alignment: Normal.

Skull base and vertebrae: Minimal asymmetric widening of the RIGHT
facet of C2-3, favored degenerative in etiology. No acute fracture.
Lucency seen on coronal view only of the posterior aspect of the
RIGHT C2 vertebral body is consistent with a nutrient foramen.

Soft tissues and spinal canal: No prevertebral fluid or swelling. No
visible canal hematoma.

Disc levels: Multilevel intervertebral disc space height loss,
severe at C3-4 and C4-5. Is moderate at C5-6 and C6-7.

Upper chest: Biapical irregular ground-glass and consolidative
opacities, possibly scarring, incompletely assessed. RIGHT apical
irregular opacity span 16 x 11 mm, LEFT apical opacity spans 17 x 13
mm. Coarse calcification of the LEFT thyroid. Atherosclerotic
calcifications of the carotids.

Other: None
IMPRESSION: 1. No definitive acute fracture or static subluxation of the
cervical spine. Minimal asymmetric widening of the RIGHT facet of
C2-3 is favored to be degenerative in etiology given asymmetric
facet arthropathy. If there is clinical concern for fracture or
ligamentous injury, MRI could be obtained for further evaluation.
2. Biapical irregular ground-glass and consolidative opacities,
possibly scarring. This is incompletely assessed. Dedicated chest CT
could be obtained for further evaluation if clinically indicated.

## 2021-06-06 DIAGNOSIS — Z20828 Contact with and (suspected) exposure to other viral communicable diseases: Secondary | ICD-10-CM | POA: Diagnosis not present

## 2021-06-06 DIAGNOSIS — Z1159 Encounter for screening for other viral diseases: Secondary | ICD-10-CM | POA: Diagnosis not present

## 2021-06-13 DIAGNOSIS — Z20828 Contact with and (suspected) exposure to other viral communicable diseases: Secondary | ICD-10-CM | POA: Diagnosis not present

## 2021-06-13 DIAGNOSIS — Z1159 Encounter for screening for other viral diseases: Secondary | ICD-10-CM | POA: Diagnosis not present

## 2021-06-15 DIAGNOSIS — Z20828 Contact with and (suspected) exposure to other viral communicable diseases: Secondary | ICD-10-CM | POA: Diagnosis not present

## 2021-06-15 DIAGNOSIS — Z1159 Encounter for screening for other viral diseases: Secondary | ICD-10-CM | POA: Diagnosis not present

## 2021-06-20 DIAGNOSIS — Z20828 Contact with and (suspected) exposure to other viral communicable diseases: Secondary | ICD-10-CM | POA: Diagnosis not present

## 2021-06-22 DIAGNOSIS — Z20828 Contact with and (suspected) exposure to other viral communicable diseases: Secondary | ICD-10-CM | POA: Diagnosis not present

## 2021-06-22 DIAGNOSIS — Z1159 Encounter for screening for other viral diseases: Secondary | ICD-10-CM | POA: Diagnosis not present

## 2021-06-29 DIAGNOSIS — Z1159 Encounter for screening for other viral diseases: Secondary | ICD-10-CM | POA: Diagnosis not present

## 2021-06-29 DIAGNOSIS — Z20828 Contact with and (suspected) exposure to other viral communicable diseases: Secondary | ICD-10-CM | POA: Diagnosis not present

## 2021-07-04 DIAGNOSIS — Z20828 Contact with and (suspected) exposure to other viral communicable diseases: Secondary | ICD-10-CM | POA: Diagnosis not present

## 2021-07-07 DIAGNOSIS — E1122 Type 2 diabetes mellitus with diabetic chronic kidney disease: Secondary | ICD-10-CM | POA: Diagnosis not present

## 2021-07-07 DIAGNOSIS — E782 Mixed hyperlipidemia: Secondary | ICD-10-CM | POA: Diagnosis not present

## 2021-07-07 DIAGNOSIS — I4891 Unspecified atrial fibrillation: Secondary | ICD-10-CM | POA: Diagnosis not present

## 2021-07-07 DIAGNOSIS — N1831 Chronic kidney disease, stage 3a: Secondary | ICD-10-CM | POA: Diagnosis not present

## 2021-07-11 DIAGNOSIS — Z20828 Contact with and (suspected) exposure to other viral communicable diseases: Secondary | ICD-10-CM | POA: Diagnosis not present

## 2021-07-18 DIAGNOSIS — Z20822 Contact with and (suspected) exposure to covid-19: Secondary | ICD-10-CM | POA: Diagnosis not present

## 2021-07-22 DIAGNOSIS — Z79899 Other long term (current) drug therapy: Secondary | ICD-10-CM | POA: Diagnosis not present

## 2021-07-22 DIAGNOSIS — I10 Essential (primary) hypertension: Secondary | ICD-10-CM | POA: Diagnosis not present

## 2021-08-02 DIAGNOSIS — N1831 Chronic kidney disease, stage 3a: Secondary | ICD-10-CM | POA: Diagnosis not present

## 2021-08-02 DIAGNOSIS — I129 Hypertensive chronic kidney disease with stage 1 through stage 4 chronic kidney disease, or unspecified chronic kidney disease: Secondary | ICD-10-CM | POA: Diagnosis not present

## 2021-08-02 DIAGNOSIS — E1122 Type 2 diabetes mellitus with diabetic chronic kidney disease: Secondary | ICD-10-CM | POA: Diagnosis not present

## 2021-08-02 DIAGNOSIS — I4891 Unspecified atrial fibrillation: Secondary | ICD-10-CM | POA: Diagnosis not present

## 2021-08-09 DIAGNOSIS — L814 Other melanin hyperpigmentation: Secondary | ICD-10-CM | POA: Diagnosis not present

## 2021-08-09 DIAGNOSIS — L570 Actinic keratosis: Secondary | ICD-10-CM | POA: Diagnosis not present

## 2021-08-09 DIAGNOSIS — L821 Other seborrheic keratosis: Secondary | ICD-10-CM | POA: Diagnosis not present

## 2021-09-13 ENCOUNTER — Other Ambulatory Visit: Payer: Self-pay

## 2021-09-13 MED ORDER — METOPROLOL SUCCINATE ER 50 MG PO TB24: 50.0000 mg | ORAL_TABLET | Freq: Every day | ORAL | 2 refills | Status: DC

## 2021-09-13 MED ORDER — LOSARTAN POTASSIUM 25 MG PO TABS: 25.0000 mg | ORAL_TABLET | Freq: Every day | ORAL | 2 refills | Status: DC

## 2021-09-14 DIAGNOSIS — Z85068 Personal history of other malignant neoplasm of small intestine: Secondary | ICD-10-CM | POA: Diagnosis not present

## 2021-09-14 DIAGNOSIS — L570 Actinic keratosis: Secondary | ICD-10-CM | POA: Diagnosis not present

## 2021-09-14 DIAGNOSIS — L821 Other seborrheic keratosis: Secondary | ICD-10-CM | POA: Diagnosis not present

## 2021-09-26 DIAGNOSIS — K5901 Slow transit constipation: Secondary | ICD-10-CM | POA: Diagnosis not present

## 2021-09-26 DIAGNOSIS — Z723 Lack of physical exercise: Secondary | ICD-10-CM | POA: Diagnosis not present

## 2021-09-26 DIAGNOSIS — I129 Hypertensive chronic kidney disease with stage 1 through stage 4 chronic kidney disease, or unspecified chronic kidney disease: Secondary | ICD-10-CM | POA: Diagnosis not present

## 2021-09-26 DIAGNOSIS — S61451A Open bite of right hand, initial encounter: Secondary | ICD-10-CM | POA: Diagnosis not present

## 2021-10-06 DIAGNOSIS — I480 Paroxysmal atrial fibrillation: Secondary | ICD-10-CM | POA: Diagnosis not present

## 2021-10-06 DIAGNOSIS — B079 Viral wart, unspecified: Secondary | ICD-10-CM | POA: Diagnosis not present

## 2021-10-06 DIAGNOSIS — L821 Other seborrheic keratosis: Secondary | ICD-10-CM | POA: Diagnosis not present

## 2021-10-06 DIAGNOSIS — D485 Neoplasm of uncertain behavior of skin: Secondary | ICD-10-CM | POA: Diagnosis not present

## 2021-10-06 DIAGNOSIS — I129 Hypertensive chronic kidney disease with stage 1 through stage 4 chronic kidney disease, or unspecified chronic kidney disease: Secondary | ICD-10-CM | POA: Diagnosis not present

## 2021-10-06 DIAGNOSIS — L820 Inflamed seborrheic keratosis: Secondary | ICD-10-CM | POA: Diagnosis not present

## 2021-10-06 DIAGNOSIS — L570 Actinic keratosis: Secondary | ICD-10-CM | POA: Diagnosis not present

## 2021-10-06 DIAGNOSIS — N1831 Chronic kidney disease, stage 3a: Secondary | ICD-10-CM | POA: Diagnosis not present

## 2021-10-06 DIAGNOSIS — E1122 Type 2 diabetes mellitus with diabetic chronic kidney disease: Secondary | ICD-10-CM | POA: Diagnosis not present

## 2021-10-31 NOTE — Progress Notes (Signed)
?Cardiology Office Note:   ? ?Date:  11/01/2021  ? ?ID:  Olivia Erickson, DOB Aug 11, 1923, MRN [id number] ? ?PCP:  Joycelyn ManFreeman, Courtney James, NP  ?Cardiologist:  Lesleigh NoeHenry W Alba Perillo III, MD  ? ?Referring MD: Joycelyn ManFreeman, Courtney James*  ? ?No chief complaint on file. ? ? ?History of Present Illness:   ? ?Olivia Baptisevelyn M Prange is a 88 y.o. female with a hx of permanent atrial fibrillation, and severe aortic stenosis. ?When seen on 10/14/2020 ?( chronic atrial fibrillation, type 2 diabetes mellitus, severe aortic stenosis, mixed hyperlipidemia, hypertensive kidney disease, coronary atherosclerosis with multivessel disease and stent placement in the proximal RCA 2019 and mid LAD and circumflex, chronic anticoagulation therapy, decreased hearing, primary hypertension.  Previously followed by [facility] Earlyne [facility]obert [facility] [place].  Last seen in October 2021 when the most recent echocardiogram was performed.  [place] in [place], Dr. Sharlet SalinaMary John Baxley). ? ?She is accompanied by her daughter.  She is conservative management.  There are no complaints voiced by the patient or daughter.  Primary care is now at [facility] who provides ambulatory home service. ? ?She denies orthopnea, PND, chest pain, syncope, and decreased exertional tolerance. ? ?Past Medical History:  ?Diagnosis Date  ? A-fib (HCC)   ? Aortic stenosis   ? SOB (shortness of breath)   ? ? ?History reviewed. No pertinent surgical history. ? ?Current Medications: ?Current Meds  ?Medication Sig  ? allopurinol (ZYLOPRIM) 100 MG tablet Take 1 tablet (100 mg total) by mouth daily.  ? Cholecalciferol (VITAMIN D3) 50 MCG (2000 UT) TABS Take 2,000 Units/day by mouth daily.  ? ELIQUIS 5 MG TABS tablet Take 2.5 mg by mouth 2 (two) times daily.  ? losartan (COZAAR) 25 MG tablet Take 1 tablet (25 mg total) by mouth daily.  ? metFORMIN (GLUCOPHAGE) 500 MG tablet Take 500 mg by mouth 2 (two) times daily with a meal. Haven't started this yet  ? metoprolol  succinate (TOPROL-XL) 50 MG 24 hr tablet Take 1 tablet (50 mg total) by mouth daily.  ? rosuvastatin (CRESTOR) 10 MG tablet Take 1 tablet (10 mg total) by mouth daily.  ? vitamin C (ASCORBIC ACID) 500 MG tablet Take 500 mg by mouth daily.  ?  ? ?Allergies:   Patient has no known allergies.  ? ?Social History  ? ?Socioeconomic History  ? Marital status: Divorced  ?  Spouse name: Not on file  ? Number of children: Not on file  ? Years of education: Not on file  ? Highest education level: Not on file  ?Occupational History  ? Not on file  ?Tobacco Use  ? Smoking status: Never  ? Smokeless tobacco: Never  ?Substance and Sexual Activity  ? Alcohol use: Not Currently  ? Drug use: Never  ? Sexual activity: Not on file  ?Other Topics Concern  ? Not on file  ?Social History Narrative  ? Not on file  ? ?Social Determinants of Health  ? ?Financial Resource Strain: Not on file  ?Food Insecurity: Not on file  ?Transportation Needs: Not on file  ?Physical Activity: Not on file  ?Stress: Not on file  ?Social Connections: Not on file  ?  ? ?Family History: ?The patient's family history includes Heart attack in her father and mother; Heart disease in her father and mother. ? ?ROS:   ?Please see the history of present illness.    ?Appetite is decreasing.  Not as physically active.  All other systems reviewed  and are negative. ? ?EKGs/Labs/Other Studies Reviewed:   ? ?The following studies were reviewed today: ?2D Doppler echocardiogram October 05, 2020: ?IMPRESSIONS  ? ? ? 1. Left ventricular ejection fraction, by estimation, is 55 to 60%. The  ?left ventricle has normal function. The left ventricle has no regional  ?wall motion abnormalities. Left ventricular diastolic function could not  ?be evaluated.  ? 2. Right ventricular systolic function is normal. The right ventricular  ?size is normal. There is normal pulmonary artery systolic pressure.  ? 3. The mitral valve is normal in structure. Moderate mitral valve  ?regurgitation. No  evidence of mitral stenosis.  ? 4. The aortic valve is tricuspid. There is moderate calcification of the  ?aortic valve. There is moderate thickening of the aortic valve. Aortic  ?valve regurgitation is not visualized. Severe aortic valve stenosis.  ? 5. Pulmonic valve regurgitation is moderate.  ? ?EKG:  EKG atrial fibrillation with controlled rate at 78 bpm.  Low voltage and in the setting of aortic stenosis, cannot exclude amyloidosis in addition to known calcific aortic stenosis. ? ?Recent Labs: ?No results found for requested labs within last 8760 hours.  ?Recent Lipid Panel ?No results found for: CHOL, TRIG, HDL, CHOLHDL, VLDL, LDLCALC, LDLDIRECT ? ?Physical Exam:   ? ?VS:  BP 128/78   Pulse 78   Ht 5\' 3"  (1.6 m)   Wt 153 lb 12.8 oz (69.8 kg)   SpO2 98%   BMI 27.24 kg/m?    ? ?Wt Readings from Last 3 Encounters:  ?11/01/21 153 lb 12.8 oz (69.8 kg)  ?04/22/21 160 lb 9.6 oz (72.8 kg)  ?10/14/20 161 lb 9.6 oz (73.3 kg)  ?  ? ?GEN: Healthy, not appearing frail.. No acute distress ?HEENT: Normal ?NECK: No JVD. ?LYMPHATICS: No lymphadenopathy ?CARDIAC: 3/6 crescendo decrescendo systolic murmur. RRR no gallop, but with left trace to 1+ and right trace edema. ?VASCULAR:  Normal Pulses. No bruits. ?RESPIRATORY:  Clear to auscultation without rales, wheezing or rhonchi  ?ABDOMEN: Soft, non-tender, non-distended, No pulsatile mass, ?MUSCULOSKELETAL: No deformity  ?SKIN: Warm and dry ?NEUROLOGIC:  Alert and oriented x 3 ?PSYCHIATRIC:  Normal affect  ? ?ASSESSMENT:   ? ?1. Aortic stenosis, severe   ?2. Coronary artery disease of native artery of native heart with stable angina pectoris (HCC)   ?3. Permanent atrial fibrillation (HCC)   ?4. Primary hypertension   ?5. Type 2 diabetes mellitus with complication, without long-term current use of insulin (HCC)   ?6. Chronic anticoagulation   ? ?PLAN:   ? ?In order of problems listed above: ? ?Severe, no further testing, expectant management, and discussed eventual  transition to hospice care when the time comes. ?No intervention planned.  Medical therapy is what is expected by the family. ?Continue low-dose Eliquis. ?Blood pressure control is adequate and actually held down by severe aortic stenosis. ?Did not discuss. ?Continue anticoagulation therapy for stroke prevention until decision made to withdraw medical therapy if that ever occurs. ? ? ?Plan conservative management.  Follow-up in 1 year.  Cognitive impairment is obvious.  We will help transition to hospice care when the time comes. ? ? ?Medication Adjustments/Labs and Tests Ordered: ?Current medicines are reviewed at length with the patient today.  Concerns regarding medicines are outlined above.  ?Orders Placed This Encounter  ?Procedures  ? EKG 12-Lead  ? ?No orders of the defined types were placed in this encounter. ? ? ?Patient Instructions  ?Medication Instructions:  ?Your physician recommends that you continue on your  current medications as directed. Please refer to the Current Medication list given to you today. ? ?*If you need a refill on your cardiac medications before your next appointment, please call your pharmacy* ? ?Lab Work: ?NONE ? ?Testing/Procedures: ?NONE ? ?Follow-Up: ?At [facility], you and your health needs are our priority.  As part of our continuing mission to provide you with exceptional heart care, we have created designated Provider Care Teams.  These Care Teams include your primary Cardiologist (physician) and Advanced Practice Providers (APPs -  Physician Assistants and Nurse Practitioners) who all work together to provide you with the care you need, when you need it. ? ?Your next appointment:   ?1 year(s) ? ?The format for your next appointment:   ?In Person ? ?Provider:   ?Lesleigh Noe, MD { ? ? ?Important Information About Sugar ? ? ? ? ?   ? ?Signed, ?Lesleigh Noe, MD  ?11/01/2021 8:47 AM    ?[facility] ?

## 2021-10-31 NOTE — Progress Notes (Signed)
Delete.

## 2021-11-01 ENCOUNTER — Encounter: Payer: Self-pay | Admitting: Interventional Cardiology

## 2021-11-01 ENCOUNTER — Ambulatory Visit: Payer: Medicare Other | Admitting: Interventional Cardiology

## 2021-11-01 VITALS — BP 128/78 | HR 78 | Ht 63.0 in | Wt 153.8 lb

## 2021-11-01 DIAGNOSIS — I4821 Permanent atrial fibrillation: Secondary | ICD-10-CM | POA: Diagnosis not present

## 2021-11-01 DIAGNOSIS — I25118 Atherosclerotic heart disease of native coronary artery with other forms of angina pectoris: Secondary | ICD-10-CM

## 2021-11-01 DIAGNOSIS — I350 Nonrheumatic aortic (valve) stenosis: Secondary | ICD-10-CM | POA: Diagnosis not present

## 2021-11-01 DIAGNOSIS — Z7901 Long term (current) use of anticoagulants: Secondary | ICD-10-CM

## 2021-11-01 DIAGNOSIS — I10 Essential (primary) hypertension: Secondary | ICD-10-CM | POA: Diagnosis not present

## 2021-11-01 DIAGNOSIS — E118 Type 2 diabetes mellitus with unspecified complications: Secondary | ICD-10-CM

## 2021-11-01 NOTE — Patient Instructions (Signed)

## 2021-11-09 DIAGNOSIS — T792XXA Traumatic secondary and recurrent hemorrhage and seroma, initial encounter: Secondary | ICD-10-CM | POA: Diagnosis not present

## 2021-11-09 DIAGNOSIS — B079 Viral wart, unspecified: Secondary | ICD-10-CM | POA: Diagnosis not present

## 2021-11-09 DIAGNOSIS — D485 Neoplasm of uncertain behavior of skin: Secondary | ICD-10-CM | POA: Diagnosis not present

## 2021-11-09 DIAGNOSIS — L570 Actinic keratosis: Secondary | ICD-10-CM | POA: Diagnosis not present

## 2021-11-09 DIAGNOSIS — Z85068 Personal history of other malignant neoplasm of small intestine: Secondary | ICD-10-CM | POA: Diagnosis not present

## 2021-12-14 DIAGNOSIS — L814 Other melanin hyperpigmentation: Secondary | ICD-10-CM | POA: Diagnosis not present

## 2021-12-14 DIAGNOSIS — L570 Actinic keratosis: Secondary | ICD-10-CM | POA: Diagnosis not present

## 2021-12-14 DIAGNOSIS — Z85068 Personal history of other malignant neoplasm of small intestine: Secondary | ICD-10-CM | POA: Diagnosis not present

## 2021-12-14 DIAGNOSIS — T792XXA Traumatic secondary and recurrent hemorrhage and seroma, initial encounter: Secondary | ICD-10-CM | POA: Diagnosis not present

## 2021-12-22 ENCOUNTER — Other Ambulatory Visit: Payer: Self-pay

## 2021-12-22 NOTE — Telephone Encounter (Signed)
[facility] requesting a refill on pt's medication Allopurinol 100 mg tablets. Would Dr. Katrinka Blazing like to refill this medication? Please address

## 2021-12-23 DIAGNOSIS — I480 Paroxysmal atrial fibrillation: Secondary | ICD-10-CM | POA: Diagnosis not present

## 2021-12-23 DIAGNOSIS — Z7901 Long term (current) use of anticoagulants: Secondary | ICD-10-CM | POA: Diagnosis not present

## 2021-12-23 DIAGNOSIS — I129 Hypertensive chronic kidney disease with stage 1 through stage 4 chronic kidney disease, or unspecified chronic kidney disease: Secondary | ICD-10-CM | POA: Diagnosis not present

## 2021-12-23 DIAGNOSIS — E1122 Type 2 diabetes mellitus with diabetic chronic kidney disease: Secondary | ICD-10-CM | POA: Diagnosis not present

## 2021-12-26 MED ORDER — ALLOPURINOL 100 MG PO TABS: 100.0000 mg | ORAL_TABLET | Freq: Every day | ORAL | 3 refills | Status: DC

## 2022-01-16 ENCOUNTER — Other Ambulatory Visit: Payer: Self-pay

## 2022-01-16 DIAGNOSIS — I4821 Permanent atrial fibrillation: Secondary | ICD-10-CM

## 2022-01-16 MED ORDER — ROSUVASTATIN CALCIUM 10 MG PO TABS: 10.0000 mg | ORAL_TABLET | Freq: Every day | ORAL | 2 refills | Status: DC

## 2022-01-16 NOTE — Telephone Encounter (Addendum)
Eliquis refill request received. Patient is 88 years old, weight-69.8kg, Crea-0.98 on 08/15/2020-NEEDS UPDATED LABS, Diagnosis-Afib, and last seen by Dr. Katrinka Blazing on 11/01/2021.  Per Dr. Michaelle Copas note: Continue low-dose Eliquis.  Spoke with pt's dtr and she stated then pt's PCP is Dr. [employer] out of [place] at [place], [phone], and that the pt sees Kelli Hope, NP.   Pt has 2 weeks of Eliquis left. Dtr aware that the pharmacy is requesting 2.5mg  tablet and dtr states that would be fine as she has been going over to [facility] to chop them in half.   Called PCP to request labs and spoke with Amy and she would fax over labs from 2023 to our fax at [phone]  Called PCP and they will refax since have not received.   01/18/2022-called PCP again and [facility] at [facility] a message for them to call back.  Called [facility] and spoke with nurse Joni Reining and she stated she would email the provider to send the labs to her at [facility] and she would fax to [phone] Fax 8/4-called [facility]   8/7-[facility] states it was faxed & number again to refax or call back with the results & date .  Missed the call from [place] and she stated she would fax and the crea was 1.04 but no date-called & left a message and left fax number again

## 2022-01-18 ENCOUNTER — Encounter: Payer: Self-pay | Admitting: *Deleted

## 2022-01-18 DIAGNOSIS — R262 Difficulty in walking, not elsewhere classified: Secondary | ICD-10-CM | POA: Diagnosis not present

## 2022-01-18 DIAGNOSIS — R278 Other lack of coordination: Secondary | ICD-10-CM | POA: Diagnosis not present

## 2022-01-18 DIAGNOSIS — M6281 Muscle weakness (generalized): Secondary | ICD-10-CM | POA: Diagnosis not present

## 2022-01-18 NOTE — Telephone Encounter (Signed)
This encounter was created in error - please disregard.

## 2022-01-19 DIAGNOSIS — R278 Other lack of coordination: Secondary | ICD-10-CM | POA: Diagnosis not present

## 2022-01-19 DIAGNOSIS — R262 Difficulty in walking, not elsewhere classified: Secondary | ICD-10-CM | POA: Diagnosis not present

## 2022-01-19 DIAGNOSIS — M6281 Muscle weakness (generalized): Secondary | ICD-10-CM | POA: Diagnosis not present

## 2022-01-20 DIAGNOSIS — Z79899 Other long term (current) drug therapy: Secondary | ICD-10-CM | POA: Diagnosis not present

## 2022-01-23 MED ORDER — APIXABAN 2.5 MG PO TABS: 2.5000 mg | ORAL_TABLET | Freq: Two times a day (BID) | ORAL | 0 refills | Status: DC

## 2022-01-24 DIAGNOSIS — M6281 Muscle weakness (generalized): Secondary | ICD-10-CM | POA: Diagnosis not present

## 2022-01-24 DIAGNOSIS — R278 Other lack of coordination: Secondary | ICD-10-CM | POA: Diagnosis not present

## 2022-01-24 DIAGNOSIS — R262 Difficulty in walking, not elsewhere classified: Secondary | ICD-10-CM | POA: Diagnosis not present

## 2022-01-26 DIAGNOSIS — M6281 Muscle weakness (generalized): Secondary | ICD-10-CM | POA: Diagnosis not present

## 2022-01-26 DIAGNOSIS — R278 Other lack of coordination: Secondary | ICD-10-CM | POA: Diagnosis not present

## 2022-01-26 DIAGNOSIS — R262 Difficulty in walking, not elsewhere classified: Secondary | ICD-10-CM | POA: Diagnosis not present

## 2022-01-27 DIAGNOSIS — R262 Difficulty in walking, not elsewhere classified: Secondary | ICD-10-CM | POA: Diagnosis not present

## 2022-01-27 DIAGNOSIS — M6281 Muscle weakness (generalized): Secondary | ICD-10-CM | POA: Diagnosis not present

## 2022-01-27 DIAGNOSIS — R278 Other lack of coordination: Secondary | ICD-10-CM | POA: Diagnosis not present

## 2022-01-30 DIAGNOSIS — M6281 Muscle weakness (generalized): Secondary | ICD-10-CM | POA: Diagnosis not present

## 2022-01-30 DIAGNOSIS — R278 Other lack of coordination: Secondary | ICD-10-CM | POA: Diagnosis not present

## 2022-01-30 DIAGNOSIS — R262 Difficulty in walking, not elsewhere classified: Secondary | ICD-10-CM | POA: Diagnosis not present

## 2022-01-31 DIAGNOSIS — R278 Other lack of coordination: Secondary | ICD-10-CM | POA: Diagnosis not present

## 2022-01-31 DIAGNOSIS — R262 Difficulty in walking, not elsewhere classified: Secondary | ICD-10-CM | POA: Diagnosis not present

## 2022-01-31 DIAGNOSIS — M6281 Muscle weakness (generalized): Secondary | ICD-10-CM | POA: Diagnosis not present

## 2022-02-02 DIAGNOSIS — R262 Difficulty in walking, not elsewhere classified: Secondary | ICD-10-CM | POA: Diagnosis not present

## 2022-02-02 DIAGNOSIS — M6281 Muscle weakness (generalized): Secondary | ICD-10-CM | POA: Diagnosis not present

## 2022-02-02 DIAGNOSIS — R278 Other lack of coordination: Secondary | ICD-10-CM | POA: Diagnosis not present

## 2022-02-06 DIAGNOSIS — R278 Other lack of coordination: Secondary | ICD-10-CM | POA: Diagnosis not present

## 2022-02-06 DIAGNOSIS — R262 Difficulty in walking, not elsewhere classified: Secondary | ICD-10-CM | POA: Diagnosis not present

## 2022-02-06 DIAGNOSIS — M6281 Muscle weakness (generalized): Secondary | ICD-10-CM | POA: Diagnosis not present

## 2022-02-07 DIAGNOSIS — R262 Difficulty in walking, not elsewhere classified: Secondary | ICD-10-CM | POA: Diagnosis not present

## 2022-02-07 DIAGNOSIS — M6281 Muscle weakness (generalized): Secondary | ICD-10-CM | POA: Diagnosis not present

## 2022-02-07 DIAGNOSIS — R278 Other lack of coordination: Secondary | ICD-10-CM | POA: Diagnosis not present

## 2022-02-09 DIAGNOSIS — I129 Hypertensive chronic kidney disease with stage 1 through stage 4 chronic kidney disease, or unspecified chronic kidney disease: Secondary | ICD-10-CM | POA: Diagnosis not present

## 2022-02-09 DIAGNOSIS — R278 Other lack of coordination: Secondary | ICD-10-CM | POA: Diagnosis not present

## 2022-02-09 DIAGNOSIS — I480 Paroxysmal atrial fibrillation: Secondary | ICD-10-CM | POA: Diagnosis not present

## 2022-02-09 DIAGNOSIS — Z7901 Long term (current) use of anticoagulants: Secondary | ICD-10-CM | POA: Diagnosis not present

## 2022-02-09 DIAGNOSIS — E1122 Type 2 diabetes mellitus with diabetic chronic kidney disease: Secondary | ICD-10-CM | POA: Diagnosis not present

## 2022-02-09 DIAGNOSIS — M6281 Muscle weakness (generalized): Secondary | ICD-10-CM | POA: Diagnosis not present

## 2022-02-09 DIAGNOSIS — R262 Difficulty in walking, not elsewhere classified: Secondary | ICD-10-CM | POA: Diagnosis not present

## 2022-02-14 DIAGNOSIS — R262 Difficulty in walking, not elsewhere classified: Secondary | ICD-10-CM | POA: Diagnosis not present

## 2022-02-14 DIAGNOSIS — M6281 Muscle weakness (generalized): Secondary | ICD-10-CM | POA: Diagnosis not present

## 2022-02-14 DIAGNOSIS — R278 Other lack of coordination: Secondary | ICD-10-CM | POA: Diagnosis not present

## 2022-02-16 DIAGNOSIS — R278 Other lack of coordination: Secondary | ICD-10-CM | POA: Diagnosis not present

## 2022-02-16 DIAGNOSIS — M6281 Muscle weakness (generalized): Secondary | ICD-10-CM | POA: Diagnosis not present

## 2022-02-16 DIAGNOSIS — R262 Difficulty in walking, not elsewhere classified: Secondary | ICD-10-CM | POA: Diagnosis not present

## 2022-02-21 DIAGNOSIS — R278 Other lack of coordination: Secondary | ICD-10-CM | POA: Diagnosis not present

## 2022-02-21 DIAGNOSIS — R262 Difficulty in walking, not elsewhere classified: Secondary | ICD-10-CM | POA: Diagnosis not present

## 2022-02-21 DIAGNOSIS — M6281 Muscle weakness (generalized): Secondary | ICD-10-CM | POA: Diagnosis not present

## 2022-02-23 DIAGNOSIS — R262 Difficulty in walking, not elsewhere classified: Secondary | ICD-10-CM | POA: Diagnosis not present

## 2022-02-23 DIAGNOSIS — M6281 Muscle weakness (generalized): Secondary | ICD-10-CM | POA: Diagnosis not present

## 2022-02-23 DIAGNOSIS — R278 Other lack of coordination: Secondary | ICD-10-CM | POA: Diagnosis not present

## 2022-02-28 DIAGNOSIS — R262 Difficulty in walking, not elsewhere classified: Secondary | ICD-10-CM | POA: Diagnosis not present

## 2022-02-28 DIAGNOSIS — M6281 Muscle weakness (generalized): Secondary | ICD-10-CM | POA: Diagnosis not present

## 2022-02-28 DIAGNOSIS — R278 Other lack of coordination: Secondary | ICD-10-CM | POA: Diagnosis not present

## 2022-03-02 DIAGNOSIS — R262 Difficulty in walking, not elsewhere classified: Secondary | ICD-10-CM | POA: Diagnosis not present

## 2022-03-02 DIAGNOSIS — R278 Other lack of coordination: Secondary | ICD-10-CM | POA: Diagnosis not present

## 2022-03-02 DIAGNOSIS — M6281 Muscle weakness (generalized): Secondary | ICD-10-CM | POA: Diagnosis not present

## 2022-03-06 DIAGNOSIS — M6281 Muscle weakness (generalized): Secondary | ICD-10-CM | POA: Diagnosis not present

## 2022-03-06 DIAGNOSIS — R262 Difficulty in walking, not elsewhere classified: Secondary | ICD-10-CM | POA: Diagnosis not present

## 2022-03-06 DIAGNOSIS — R278 Other lack of coordination: Secondary | ICD-10-CM | POA: Diagnosis not present

## 2022-03-09 DIAGNOSIS — R278 Other lack of coordination: Secondary | ICD-10-CM | POA: Diagnosis not present

## 2022-03-09 DIAGNOSIS — M6281 Muscle weakness (generalized): Secondary | ICD-10-CM | POA: Diagnosis not present

## 2022-03-09 DIAGNOSIS — R262 Difficulty in walking, not elsewhere classified: Secondary | ICD-10-CM | POA: Diagnosis not present

## 2022-03-13 DIAGNOSIS — R262 Difficulty in walking, not elsewhere classified: Secondary | ICD-10-CM | POA: Diagnosis not present

## 2022-03-13 DIAGNOSIS — M6281 Muscle weakness (generalized): Secondary | ICD-10-CM | POA: Diagnosis not present

## 2022-03-13 DIAGNOSIS — R278 Other lack of coordination: Secondary | ICD-10-CM | POA: Diagnosis not present

## 2022-03-17 DIAGNOSIS — M6281 Muscle weakness (generalized): Secondary | ICD-10-CM | POA: Diagnosis not present

## 2022-03-17 DIAGNOSIS — R278 Other lack of coordination: Secondary | ICD-10-CM | POA: Diagnosis not present

## 2022-03-17 DIAGNOSIS — R262 Difficulty in walking, not elsewhere classified: Secondary | ICD-10-CM | POA: Diagnosis not present

## 2022-03-20 DIAGNOSIS — R262 Difficulty in walking, not elsewhere classified: Secondary | ICD-10-CM | POA: Diagnosis not present

## 2022-03-20 DIAGNOSIS — R278 Other lack of coordination: Secondary | ICD-10-CM | POA: Diagnosis not present

## 2022-03-20 DIAGNOSIS — M6281 Muscle weakness (generalized): Secondary | ICD-10-CM | POA: Diagnosis not present

## 2022-03-21 DIAGNOSIS — R278 Other lack of coordination: Secondary | ICD-10-CM | POA: Diagnosis not present

## 2022-03-21 DIAGNOSIS — M6281 Muscle weakness (generalized): Secondary | ICD-10-CM | POA: Diagnosis not present

## 2022-03-21 DIAGNOSIS — R262 Difficulty in walking, not elsewhere classified: Secondary | ICD-10-CM | POA: Diagnosis not present

## 2022-03-23 DIAGNOSIS — M6281 Muscle weakness (generalized): Secondary | ICD-10-CM | POA: Diagnosis not present

## 2022-03-23 DIAGNOSIS — R262 Difficulty in walking, not elsewhere classified: Secondary | ICD-10-CM | POA: Diagnosis not present

## 2022-03-23 DIAGNOSIS — R278 Other lack of coordination: Secondary | ICD-10-CM | POA: Diagnosis not present

## 2022-03-28 DIAGNOSIS — R278 Other lack of coordination: Secondary | ICD-10-CM | POA: Diagnosis not present

## 2022-03-28 DIAGNOSIS — M6281 Muscle weakness (generalized): Secondary | ICD-10-CM | POA: Diagnosis not present

## 2022-03-28 DIAGNOSIS — R262 Difficulty in walking, not elsewhere classified: Secondary | ICD-10-CM | POA: Diagnosis not present

## 2022-03-30 DIAGNOSIS — M6281 Muscle weakness (generalized): Secondary | ICD-10-CM | POA: Diagnosis not present

## 2022-03-30 DIAGNOSIS — R262 Difficulty in walking, not elsewhere classified: Secondary | ICD-10-CM | POA: Diagnosis not present

## 2022-03-30 DIAGNOSIS — R278 Other lack of coordination: Secondary | ICD-10-CM | POA: Diagnosis not present

## 2022-04-04 DIAGNOSIS — R262 Difficulty in walking, not elsewhere classified: Secondary | ICD-10-CM | POA: Diagnosis not present

## 2022-04-04 DIAGNOSIS — M6281 Muscle weakness (generalized): Secondary | ICD-10-CM | POA: Diagnosis not present

## 2022-04-04 DIAGNOSIS — R278 Other lack of coordination: Secondary | ICD-10-CM | POA: Diagnosis not present

## 2022-04-06 DIAGNOSIS — R278 Other lack of coordination: Secondary | ICD-10-CM | POA: Diagnosis not present

## 2022-04-06 DIAGNOSIS — M6281 Muscle weakness (generalized): Secondary | ICD-10-CM | POA: Diagnosis not present

## 2022-04-06 DIAGNOSIS — R262 Difficulty in walking, not elsewhere classified: Secondary | ICD-10-CM | POA: Diagnosis not present

## 2022-04-11 DIAGNOSIS — M6281 Muscle weakness (generalized): Secondary | ICD-10-CM | POA: Diagnosis not present

## 2022-04-11 DIAGNOSIS — R278 Other lack of coordination: Secondary | ICD-10-CM | POA: Diagnosis not present

## 2022-04-11 DIAGNOSIS — R262 Difficulty in walking, not elsewhere classified: Secondary | ICD-10-CM | POA: Diagnosis not present

## 2022-04-18 DIAGNOSIS — R278 Other lack of coordination: Secondary | ICD-10-CM | POA: Diagnosis not present

## 2022-04-18 DIAGNOSIS — R262 Difficulty in walking, not elsewhere classified: Secondary | ICD-10-CM | POA: Diagnosis not present

## 2022-04-18 DIAGNOSIS — M6281 Muscle weakness (generalized): Secondary | ICD-10-CM | POA: Diagnosis not present

## 2022-04-20 DIAGNOSIS — R262 Difficulty in walking, not elsewhere classified: Secondary | ICD-10-CM | POA: Diagnosis not present

## 2022-04-20 DIAGNOSIS — M6281 Muscle weakness (generalized): Secondary | ICD-10-CM | POA: Diagnosis not present

## 2022-04-20 DIAGNOSIS — R278 Other lack of coordination: Secondary | ICD-10-CM | POA: Diagnosis not present

## 2022-04-24 DIAGNOSIS — E782 Mixed hyperlipidemia: Secondary | ICD-10-CM | POA: Diagnosis not present

## 2022-04-24 DIAGNOSIS — I480 Paroxysmal atrial fibrillation: Secondary | ICD-10-CM | POA: Diagnosis not present

## 2022-04-24 DIAGNOSIS — Z7901 Long term (current) use of anticoagulants: Secondary | ICD-10-CM | POA: Diagnosis not present

## 2022-04-24 DIAGNOSIS — Z723 Lack of physical exercise: Secondary | ICD-10-CM | POA: Diagnosis not present

## 2022-04-25 DIAGNOSIS — M6281 Muscle weakness (generalized): Secondary | ICD-10-CM | POA: Diagnosis not present

## 2022-04-25 DIAGNOSIS — R278 Other lack of coordination: Secondary | ICD-10-CM | POA: Diagnosis not present

## 2022-04-25 DIAGNOSIS — R262 Difficulty in walking, not elsewhere classified: Secondary | ICD-10-CM | POA: Diagnosis not present

## 2022-04-27 DIAGNOSIS — M6281 Muscle weakness (generalized): Secondary | ICD-10-CM | POA: Diagnosis not present

## 2022-04-27 DIAGNOSIS — R278 Other lack of coordination: Secondary | ICD-10-CM | POA: Diagnosis not present

## 2022-04-27 DIAGNOSIS — R262 Difficulty in walking, not elsewhere classified: Secondary | ICD-10-CM | POA: Diagnosis not present

## 2022-05-01 ENCOUNTER — Other Ambulatory Visit: Payer: Self-pay | Admitting: *Deleted

## 2022-05-01 DIAGNOSIS — I4821 Permanent atrial fibrillation: Secondary | ICD-10-CM

## 2022-05-01 NOTE — Telephone Encounter (Addendum)
Eliquis 2.5mg  refill request received. Patient is 88 years old, weight-69.8kg, Crea-0.98 on 08/15/2020-NEEDS LABS, Diagnosis-Afib, and last seen by Dr. Katrinka Blazing on 11/01/2021. Per last OV note Dr. Katrinka Blazing states to continue low-dose Eliquis.   Pt needs updated labs, will call her PCP/[facility] to see if any recent.   Called [facility] in reference to this pt as her PCP is [facility]. Spoke with Vikki Ports at [facility] and she will assess the pt's chart for the labs and call or fax over.  Previous labs were not received in July 2023; Last call encounter states Colin Mulders was going to fax over (see 01/16/2022 note).

## 2022-05-02 DIAGNOSIS — R262 Difficulty in walking, not elsewhere classified: Secondary | ICD-10-CM | POA: Diagnosis not present

## 2022-05-02 DIAGNOSIS — M6281 Muscle weakness (generalized): Secondary | ICD-10-CM | POA: Diagnosis not present

## 2022-05-02 DIAGNOSIS — R278 Other lack of coordination: Secondary | ICD-10-CM | POA: Diagnosis not present

## 2022-05-02 NOTE — Telephone Encounter (Addendum)
Called [facility] again in reference to the pt's labs. Spoke with valerie and she states she forwarded the message to [place], who comes in later today. She did send me to her voicemail so left a message regarding this and call back number. Will await.    Constance Haw from [facility] called and stated she can fax over the labwork from August 2023. She states the last Crea was 1.04 in August 2023. Also, gave the fax number of [phone]  05/03/2022 at 726am Labs received via fax. Confirmed Crea 1.04 on 01/23/2022. 89 yrs, wt-69.8kg, per dr. Katrinka Blazing last OV continue low dose eliquis.

## 2022-05-03 MED ORDER — APIXABAN 2.5 MG PO TABS: 2.5000 mg | ORAL_TABLET | Freq: Two times a day (BID) | ORAL | 1 refills | Status: DC

## 2022-05-04 DIAGNOSIS — M6281 Muscle weakness (generalized): Secondary | ICD-10-CM | POA: Diagnosis not present

## 2022-05-04 DIAGNOSIS — R278 Other lack of coordination: Secondary | ICD-10-CM | POA: Diagnosis not present

## 2022-05-04 DIAGNOSIS — R262 Difficulty in walking, not elsewhere classified: Secondary | ICD-10-CM | POA: Diagnosis not present

## 2022-05-08 DIAGNOSIS — M6281 Muscle weakness (generalized): Secondary | ICD-10-CM | POA: Diagnosis not present

## 2022-05-08 DIAGNOSIS — R278 Other lack of coordination: Secondary | ICD-10-CM | POA: Diagnosis not present

## 2022-05-08 DIAGNOSIS — R262 Difficulty in walking, not elsewhere classified: Secondary | ICD-10-CM | POA: Diagnosis not present

## 2022-05-09 DIAGNOSIS — M6281 Muscle weakness (generalized): Secondary | ICD-10-CM | POA: Diagnosis not present

## 2022-05-09 DIAGNOSIS — R262 Difficulty in walking, not elsewhere classified: Secondary | ICD-10-CM | POA: Diagnosis not present

## 2022-05-09 DIAGNOSIS — R278 Other lack of coordination: Secondary | ICD-10-CM | POA: Diagnosis not present

## 2022-05-17 DIAGNOSIS — R262 Difficulty in walking, not elsewhere classified: Secondary | ICD-10-CM | POA: Diagnosis not present

## 2022-05-17 DIAGNOSIS — R278 Other lack of coordination: Secondary | ICD-10-CM | POA: Diagnosis not present

## 2022-05-17 DIAGNOSIS — M6281 Muscle weakness (generalized): Secondary | ICD-10-CM | POA: Diagnosis not present

## 2022-06-29 DIAGNOSIS — Z7901 Long term (current) use of anticoagulants: Secondary | ICD-10-CM | POA: Diagnosis not present

## 2022-06-29 DIAGNOSIS — I129 Hypertensive chronic kidney disease with stage 1 through stage 4 chronic kidney disease, or unspecified chronic kidney disease: Secondary | ICD-10-CM | POA: Diagnosis not present

## 2022-06-29 DIAGNOSIS — I480 Paroxysmal atrial fibrillation: Secondary | ICD-10-CM | POA: Diagnosis not present

## 2022-06-29 DIAGNOSIS — E782 Mixed hyperlipidemia: Secondary | ICD-10-CM | POA: Diagnosis not present

## 2022-07-13 DIAGNOSIS — R300 Dysuria: Secondary | ICD-10-CM | POA: Diagnosis not present

## 2022-07-17 DIAGNOSIS — Z79899 Other long term (current) drug therapy: Secondary | ICD-10-CM | POA: Diagnosis not present

## 2022-07-20 DIAGNOSIS — Z79899 Other long term (current) drug therapy: Secondary | ICD-10-CM | POA: Diagnosis not present

## 2022-09-11 DIAGNOSIS — R531 Weakness: Secondary | ICD-10-CM | POA: Diagnosis not present

## 2022-09-21 ENCOUNTER — Other Ambulatory Visit: Payer: Self-pay

## 2022-09-21 DIAGNOSIS — R41840 Attention and concentration deficit: Secondary | ICD-10-CM | POA: Diagnosis not present

## 2022-09-21 DIAGNOSIS — M6281 Muscle weakness (generalized): Secondary | ICD-10-CM | POA: Diagnosis not present

## 2022-09-21 MED ORDER — LOSARTAN POTASSIUM 25 MG PO TABS: 25.0000 mg | ORAL_TABLET | Freq: Every day | ORAL | 3 refills | Status: DC

## 2022-09-23 DIAGNOSIS — K5909 Other constipation: Secondary | ICD-10-CM | POA: Diagnosis not present

## 2022-09-23 DIAGNOSIS — K649 Unspecified hemorrhoids: Secondary | ICD-10-CM | POA: Diagnosis not present

## 2022-09-28 DIAGNOSIS — E782 Mixed hyperlipidemia: Secondary | ICD-10-CM | POA: Diagnosis not present

## 2022-09-28 DIAGNOSIS — Z7901 Long term (current) use of anticoagulants: Secondary | ICD-10-CM | POA: Diagnosis not present

## 2022-09-28 DIAGNOSIS — I480 Paroxysmal atrial fibrillation: Secondary | ICD-10-CM | POA: Diagnosis not present

## 2022-09-28 DIAGNOSIS — R41840 Attention and concentration deficit: Secondary | ICD-10-CM | POA: Diagnosis not present

## 2022-09-28 DIAGNOSIS — I129 Hypertensive chronic kidney disease with stage 1 through stage 4 chronic kidney disease, or unspecified chronic kidney disease: Secondary | ICD-10-CM | POA: Diagnosis not present

## 2022-09-28 DIAGNOSIS — M6281 Muscle weakness (generalized): Secondary | ICD-10-CM | POA: Diagnosis not present

## 2022-09-29 DIAGNOSIS — M6281 Muscle weakness (generalized): Secondary | ICD-10-CM | POA: Diagnosis not present

## 2022-09-29 DIAGNOSIS — R41840 Attention and concentration deficit: Secondary | ICD-10-CM | POA: Diagnosis not present

## 2022-10-03 DIAGNOSIS — M6281 Muscle weakness (generalized): Secondary | ICD-10-CM | POA: Diagnosis not present

## 2022-10-03 DIAGNOSIS — R41840 Attention and concentration deficit: Secondary | ICD-10-CM | POA: Diagnosis not present

## 2022-10-06 ENCOUNTER — Other Ambulatory Visit: Payer: Self-pay

## 2022-10-06 DIAGNOSIS — M6281 Muscle weakness (generalized): Secondary | ICD-10-CM | POA: Diagnosis not present

## 2022-10-06 DIAGNOSIS — R41840 Attention and concentration deficit: Secondary | ICD-10-CM | POA: Diagnosis not present

## 2022-10-06 MED ORDER — METOPROLOL SUCCINATE ER 50 MG PO TB24: 50.0000 mg | ORAL_TABLET | Freq: Every day | ORAL | 0 refills | Status: DC

## 2022-10-06 NOTE — Telephone Encounter (Signed)
Pt's medication was sent to pt's pharmacy as requested. Confirmation received.  °

## 2022-10-10 DIAGNOSIS — R41840 Attention and concentration deficit: Secondary | ICD-10-CM | POA: Diagnosis not present

## 2022-10-10 DIAGNOSIS — M6281 Muscle weakness (generalized): Secondary | ICD-10-CM | POA: Diagnosis not present

## 2022-10-11 DIAGNOSIS — M6281 Muscle weakness (generalized): Secondary | ICD-10-CM | POA: Diagnosis not present

## 2022-10-11 DIAGNOSIS — R41840 Attention and concentration deficit: Secondary | ICD-10-CM | POA: Diagnosis not present

## 2022-10-12 DIAGNOSIS — R296 Repeated falls: Secondary | ICD-10-CM | POA: Diagnosis not present

## 2022-10-12 DIAGNOSIS — R2689 Other abnormalities of gait and mobility: Secondary | ICD-10-CM | POA: Diagnosis not present

## 2022-10-12 DIAGNOSIS — M6259 Muscle wasting and atrophy, not elsewhere classified, multiple sites: Secondary | ICD-10-CM | POA: Diagnosis not present

## 2022-10-13 DIAGNOSIS — R41840 Attention and concentration deficit: Secondary | ICD-10-CM | POA: Diagnosis not present

## 2022-10-13 DIAGNOSIS — M6281 Muscle weakness (generalized): Secondary | ICD-10-CM | POA: Diagnosis not present

## 2022-10-17 DIAGNOSIS — M6259 Muscle wasting and atrophy, not elsewhere classified, multiple sites: Secondary | ICD-10-CM | POA: Diagnosis not present

## 2022-10-17 DIAGNOSIS — R2689 Other abnormalities of gait and mobility: Secondary | ICD-10-CM | POA: Diagnosis not present

## 2022-10-17 DIAGNOSIS — R296 Repeated falls: Secondary | ICD-10-CM | POA: Diagnosis not present

## 2022-10-19 DIAGNOSIS — R296 Repeated falls: Secondary | ICD-10-CM | POA: Diagnosis not present

## 2022-10-19 DIAGNOSIS — R2689 Other abnormalities of gait and mobility: Secondary | ICD-10-CM | POA: Diagnosis not present

## 2022-10-19 DIAGNOSIS — M6281 Muscle weakness (generalized): Secondary | ICD-10-CM | POA: Diagnosis not present

## 2022-10-19 DIAGNOSIS — R41840 Attention and concentration deficit: Secondary | ICD-10-CM | POA: Diagnosis not present

## 2022-10-19 DIAGNOSIS — M6259 Muscle wasting and atrophy, not elsewhere classified, multiple sites: Secondary | ICD-10-CM | POA: Diagnosis not present

## 2022-10-20 ENCOUNTER — Other Ambulatory Visit: Payer: Self-pay

## 2022-10-20 MED ORDER — ROSUVASTATIN CALCIUM 10 MG PO TABS: 10.0000 mg | ORAL_TABLET | Freq: Every day | ORAL | 0 refills | Status: DC

## 2022-10-23 DIAGNOSIS — K080 Exfoliation of teeth due to systemic causes: Secondary | ICD-10-CM | POA: Diagnosis not present

## 2022-10-24 DIAGNOSIS — M6259 Muscle wasting and atrophy, not elsewhere classified, multiple sites: Secondary | ICD-10-CM | POA: Diagnosis not present

## 2022-10-24 DIAGNOSIS — R2689 Other abnormalities of gait and mobility: Secondary | ICD-10-CM | POA: Diagnosis not present

## 2022-10-24 DIAGNOSIS — R296 Repeated falls: Secondary | ICD-10-CM | POA: Diagnosis not present

## 2022-10-25 ENCOUNTER — Telehealth: Payer: Self-pay | Admitting: Physician Assistant

## 2022-10-25 DIAGNOSIS — R41840 Attention and concentration deficit: Secondary | ICD-10-CM | POA: Diagnosis not present

## 2022-10-25 DIAGNOSIS — M6281 Muscle weakness (generalized): Secondary | ICD-10-CM | POA: Diagnosis not present

## 2022-10-25 NOTE — Telephone Encounter (Signed)
Pt c/o medication issue:  1. Name of Medication:  rosuvastatin (CRESTOR) 10 MG tablet  2. How are you currently taking this medication (dosage and times per day)?   3. Are you having a reaction (difficulty breathing--STAT)?   4. What is your medication issue?  Yolanda with [facility] states they received a prescription for Rosuvastatin from different doctor who said that 5 MG replaced the 10 MG. They got a prescription from our office for 10 MG and they would like to clarify whether this will replace the 5 MG.

## 2022-10-25 NOTE — Telephone Encounter (Signed)
Spoke to the pharmacists with [facility], the pharmacy has two prescriptions for Crestor 5 mg and 10 mg. [facility] wanted to clarify which dosage correct. the pharmacy received notification from [facility])  in January that Crestor 5 mg was to replace 10 mg. On 5/3 the pharmacy received a prescription to refill Crestor 10 mg from [place], [place]. Will forward to APP for advise.

## 2022-10-26 DIAGNOSIS — R2689 Other abnormalities of gait and mobility: Secondary | ICD-10-CM | POA: Diagnosis not present

## 2022-10-26 DIAGNOSIS — M6259 Muscle wasting and atrophy, not elsewhere classified, multiple sites: Secondary | ICD-10-CM | POA: Diagnosis not present

## 2022-10-26 DIAGNOSIS — R296 Repeated falls: Secondary | ICD-10-CM | POA: Diagnosis not present

## 2022-10-26 NOTE — Telephone Encounter (Signed)
Spoke to [employer] from [place], explained [place], [place] recommendations:   I am a little bit confused how my name ended up on the prescription.   I have not seen this patient yet.   It looks like she has an appointment with me in July.  I also do not see a recent lipid panel on this patient so  I do not feel comfortable weighing in on dosage.  I would say to stick with 5 mg of Crestor for now since this is what the patient  has been on and plan for a lipid panel when she comes and sees me in July.   We can reevaluate at that time and go up to 10 mg if needed.    Candace voiced understanding.

## 2022-10-30 DIAGNOSIS — R41840 Attention and concentration deficit: Secondary | ICD-10-CM | POA: Diagnosis not present

## 2022-10-30 DIAGNOSIS — M6281 Muscle weakness (generalized): Secondary | ICD-10-CM | POA: Diagnosis not present

## 2022-10-31 DIAGNOSIS — M6259 Muscle wasting and atrophy, not elsewhere classified, multiple sites: Secondary | ICD-10-CM | POA: Diagnosis not present

## 2022-10-31 DIAGNOSIS — R2689 Other abnormalities of gait and mobility: Secondary | ICD-10-CM | POA: Diagnosis not present

## 2022-10-31 DIAGNOSIS — R296 Repeated falls: Secondary | ICD-10-CM | POA: Diagnosis not present

## 2022-11-01 DIAGNOSIS — R41840 Attention and concentration deficit: Secondary | ICD-10-CM | POA: Diagnosis not present

## 2022-11-01 DIAGNOSIS — M6281 Muscle weakness (generalized): Secondary | ICD-10-CM | POA: Diagnosis not present

## 2022-11-02 DIAGNOSIS — M6259 Muscle wasting and atrophy, not elsewhere classified, multiple sites: Secondary | ICD-10-CM | POA: Diagnosis not present

## 2022-11-02 DIAGNOSIS — R296 Repeated falls: Secondary | ICD-10-CM | POA: Diagnosis not present

## 2022-11-02 DIAGNOSIS — R2689 Other abnormalities of gait and mobility: Secondary | ICD-10-CM | POA: Diagnosis not present

## 2022-11-03 DIAGNOSIS — M6281 Muscle weakness (generalized): Secondary | ICD-10-CM | POA: Diagnosis not present

## 2022-11-03 DIAGNOSIS — R41840 Attention and concentration deficit: Secondary | ICD-10-CM | POA: Diagnosis not present

## 2022-11-06 DIAGNOSIS — M6281 Muscle weakness (generalized): Secondary | ICD-10-CM | POA: Diagnosis not present

## 2022-11-06 DIAGNOSIS — R41840 Attention and concentration deficit: Secondary | ICD-10-CM | POA: Diagnosis not present

## 2022-11-07 ENCOUNTER — Ambulatory Visit: Payer: Medicare Other | Admitting: Cardiology

## 2022-11-07 DIAGNOSIS — R2689 Other abnormalities of gait and mobility: Secondary | ICD-10-CM | POA: Diagnosis not present

## 2022-11-07 DIAGNOSIS — M6259 Muscle wasting and atrophy, not elsewhere classified, multiple sites: Secondary | ICD-10-CM | POA: Diagnosis not present

## 2022-11-07 DIAGNOSIS — R296 Repeated falls: Secondary | ICD-10-CM | POA: Diagnosis not present

## 2022-11-09 DIAGNOSIS — M6259 Muscle wasting and atrophy, not elsewhere classified, multiple sites: Secondary | ICD-10-CM | POA: Diagnosis not present

## 2022-11-09 DIAGNOSIS — M6281 Muscle weakness (generalized): Secondary | ICD-10-CM | POA: Diagnosis not present

## 2022-11-09 DIAGNOSIS — R296 Repeated falls: Secondary | ICD-10-CM | POA: Diagnosis not present

## 2022-11-09 DIAGNOSIS — R41840 Attention and concentration deficit: Secondary | ICD-10-CM | POA: Diagnosis not present

## 2022-11-09 DIAGNOSIS — R2689 Other abnormalities of gait and mobility: Secondary | ICD-10-CM | POA: Diagnosis not present

## 2022-11-14 DIAGNOSIS — R296 Repeated falls: Secondary | ICD-10-CM | POA: Diagnosis not present

## 2022-11-14 DIAGNOSIS — M6259 Muscle wasting and atrophy, not elsewhere classified, multiple sites: Secondary | ICD-10-CM | POA: Diagnosis not present

## 2022-11-14 DIAGNOSIS — R2689 Other abnormalities of gait and mobility: Secondary | ICD-10-CM | POA: Diagnosis not present

## 2022-11-15 DIAGNOSIS — R41840 Attention and concentration deficit: Secondary | ICD-10-CM | POA: Diagnosis not present

## 2022-11-15 DIAGNOSIS — M6281 Muscle weakness (generalized): Secondary | ICD-10-CM | POA: Diagnosis not present

## 2022-11-16 ENCOUNTER — Other Ambulatory Visit: Payer: Self-pay

## 2022-11-16 DIAGNOSIS — I4821 Permanent atrial fibrillation: Secondary | ICD-10-CM

## 2022-11-16 DIAGNOSIS — R41840 Attention and concentration deficit: Secondary | ICD-10-CM | POA: Diagnosis not present

## 2022-11-16 DIAGNOSIS — M6281 Muscle weakness (generalized): Secondary | ICD-10-CM | POA: Diagnosis not present

## 2022-11-16 MED ORDER — APIXABAN 2.5 MG PO TABS: 2.5000 mg | ORAL_TABLET | Freq: Two times a day (BID) | ORAL | 1 refills | Status: DC

## 2022-11-16 NOTE — Telephone Encounter (Signed)
Prescription refill request for Eliquis received. Indication: Afib  Last office visit: 11/01/21 Katrinka Blazing)  Scr: 1.04 (01/20/22)  Age: 89 Weight: 69.8kg  Office visit overdue. Pt has scheduled appt on 12/22/22 with Jari Favre. Refill sent.

## 2022-11-17 DIAGNOSIS — E1122 Type 2 diabetes mellitus with diabetic chronic kidney disease: Secondary | ICD-10-CM | POA: Diagnosis not present

## 2022-11-17 DIAGNOSIS — M6259 Muscle wasting and atrophy, not elsewhere classified, multiple sites: Secondary | ICD-10-CM | POA: Diagnosis not present

## 2022-11-17 DIAGNOSIS — R296 Repeated falls: Secondary | ICD-10-CM | POA: Diagnosis not present

## 2022-11-17 DIAGNOSIS — I10 Essential (primary) hypertension: Secondary | ICD-10-CM | POA: Diagnosis not present

## 2022-11-17 DIAGNOSIS — R2689 Other abnormalities of gait and mobility: Secondary | ICD-10-CM | POA: Diagnosis not present

## 2022-11-20 DIAGNOSIS — R296 Repeated falls: Secondary | ICD-10-CM | POA: Diagnosis not present

## 2022-11-20 DIAGNOSIS — R2689 Other abnormalities of gait and mobility: Secondary | ICD-10-CM | POA: Diagnosis not present

## 2022-11-20 DIAGNOSIS — M6259 Muscle wasting and atrophy, not elsewhere classified, multiple sites: Secondary | ICD-10-CM | POA: Diagnosis not present

## 2022-11-23 DIAGNOSIS — R2689 Other abnormalities of gait and mobility: Secondary | ICD-10-CM | POA: Diagnosis not present

## 2022-11-23 DIAGNOSIS — M6259 Muscle wasting and atrophy, not elsewhere classified, multiple sites: Secondary | ICD-10-CM | POA: Diagnosis not present

## 2022-11-23 DIAGNOSIS — R296 Repeated falls: Secondary | ICD-10-CM | POA: Diagnosis not present

## 2022-11-24 DIAGNOSIS — R41840 Attention and concentration deficit: Secondary | ICD-10-CM | POA: Diagnosis not present

## 2022-11-24 DIAGNOSIS — M6281 Muscle weakness (generalized): Secondary | ICD-10-CM | POA: Diagnosis not present

## 2022-11-28 DIAGNOSIS — R296 Repeated falls: Secondary | ICD-10-CM | POA: Diagnosis not present

## 2022-11-28 DIAGNOSIS — R2689 Other abnormalities of gait and mobility: Secondary | ICD-10-CM | POA: Diagnosis not present

## 2022-11-28 DIAGNOSIS — M6259 Muscle wasting and atrophy, not elsewhere classified, multiple sites: Secondary | ICD-10-CM | POA: Diagnosis not present

## 2022-11-29 DIAGNOSIS — M6281 Muscle weakness (generalized): Secondary | ICD-10-CM | POA: Diagnosis not present

## 2022-11-29 DIAGNOSIS — R41840 Attention and concentration deficit: Secondary | ICD-10-CM | POA: Diagnosis not present

## 2022-11-30 DIAGNOSIS — M6259 Muscle wasting and atrophy, not elsewhere classified, multiple sites: Secondary | ICD-10-CM | POA: Diagnosis not present

## 2022-11-30 DIAGNOSIS — R2689 Other abnormalities of gait and mobility: Secondary | ICD-10-CM | POA: Diagnosis not present

## 2022-11-30 DIAGNOSIS — R296 Repeated falls: Secondary | ICD-10-CM | POA: Diagnosis not present

## 2022-12-05 DIAGNOSIS — M6259 Muscle wasting and atrophy, not elsewhere classified, multiple sites: Secondary | ICD-10-CM | POA: Diagnosis not present

## 2022-12-05 DIAGNOSIS — R2689 Other abnormalities of gait and mobility: Secondary | ICD-10-CM | POA: Diagnosis not present

## 2022-12-05 DIAGNOSIS — R296 Repeated falls: Secondary | ICD-10-CM | POA: Diagnosis not present

## 2022-12-06 DIAGNOSIS — R41840 Attention and concentration deficit: Secondary | ICD-10-CM | POA: Diagnosis not present

## 2022-12-06 DIAGNOSIS — M6281 Muscle weakness (generalized): Secondary | ICD-10-CM | POA: Diagnosis not present

## 2022-12-07 DIAGNOSIS — R296 Repeated falls: Secondary | ICD-10-CM | POA: Diagnosis not present

## 2022-12-07 DIAGNOSIS — R2689 Other abnormalities of gait and mobility: Secondary | ICD-10-CM | POA: Diagnosis not present

## 2022-12-07 DIAGNOSIS — M6259 Muscle wasting and atrophy, not elsewhere classified, multiple sites: Secondary | ICD-10-CM | POA: Diagnosis not present

## 2022-12-11 DIAGNOSIS — R2689 Other abnormalities of gait and mobility: Secondary | ICD-10-CM | POA: Diagnosis not present

## 2022-12-11 DIAGNOSIS — M6259 Muscle wasting and atrophy, not elsewhere classified, multiple sites: Secondary | ICD-10-CM | POA: Diagnosis not present

## 2022-12-11 DIAGNOSIS — R296 Repeated falls: Secondary | ICD-10-CM | POA: Diagnosis not present

## 2022-12-12 DIAGNOSIS — M6259 Muscle wasting and atrophy, not elsewhere classified, multiple sites: Secondary | ICD-10-CM | POA: Diagnosis not present

## 2022-12-12 DIAGNOSIS — R41840 Attention and concentration deficit: Secondary | ICD-10-CM | POA: Diagnosis not present

## 2022-12-12 DIAGNOSIS — R296 Repeated falls: Secondary | ICD-10-CM | POA: Diagnosis not present

## 2022-12-12 DIAGNOSIS — R2689 Other abnormalities of gait and mobility: Secondary | ICD-10-CM | POA: Diagnosis not present

## 2022-12-12 DIAGNOSIS — M6281 Muscle weakness (generalized): Secondary | ICD-10-CM | POA: Diagnosis not present

## 2022-12-19 DIAGNOSIS — M6259 Muscle wasting and atrophy, not elsewhere classified, multiple sites: Secondary | ICD-10-CM | POA: Diagnosis not present

## 2022-12-19 DIAGNOSIS — R296 Repeated falls: Secondary | ICD-10-CM | POA: Diagnosis not present

## 2022-12-19 DIAGNOSIS — R2689 Other abnormalities of gait and mobility: Secondary | ICD-10-CM | POA: Diagnosis not present

## 2022-12-22 ENCOUNTER — Encounter: Payer: Self-pay | Admitting: Physician Assistant

## 2022-12-22 ENCOUNTER — Ambulatory Visit: Payer: Medicare Other | Attending: Physician Assistant | Admitting: Physician Assistant

## 2022-12-22 VITALS — BP 114/80 | HR 68 | Ht 63.0 in | Wt 159.4 lb

## 2022-12-22 DIAGNOSIS — Z7901 Long term (current) use of anticoagulants: Secondary | ICD-10-CM

## 2022-12-22 DIAGNOSIS — I350 Nonrheumatic aortic (valve) stenosis: Secondary | ICD-10-CM

## 2022-12-22 DIAGNOSIS — I25118 Atherosclerotic heart disease of native coronary artery with other forms of angina pectoris: Secondary | ICD-10-CM

## 2022-12-22 DIAGNOSIS — I4821 Permanent atrial fibrillation: Secondary | ICD-10-CM | POA: Diagnosis not present

## 2022-12-22 DIAGNOSIS — I10 Essential (primary) hypertension: Secondary | ICD-10-CM

## 2022-12-22 DIAGNOSIS — Z7984 Long term (current) use of oral hypoglycemic drugs: Secondary | ICD-10-CM

## 2022-12-22 DIAGNOSIS — E118 Type 2 diabetes mellitus with unspecified complications: Secondary | ICD-10-CM

## 2022-12-22 MED ORDER — METOPROLOL SUCCINATE ER 50 MG PO TB24: 50.0000 mg | ORAL_TABLET | Freq: Every day | ORAL | 3 refills | Status: DC

## 2022-12-22 MED ORDER — ROSUVASTATIN CALCIUM 10 MG PO TABS: 10.0000 mg | ORAL_TABLET | Freq: Every day | ORAL | 3 refills | Status: DC

## 2022-12-22 MED ORDER — GLIPIZIDE ER 2.5 MG PO TB24: 2.5000 mg | ORAL_TABLET | Freq: Every day | ORAL | 1 refills | Status: DC

## 2022-12-22 NOTE — Progress Notes (Signed)
Cardiology Office Note:  .   Date:  12/22/2022  ID:  Olivia Erickson, DOB Oct 06, 1923, MRN [id number] PCP: Joycelyn Man, NP  [facility] Providers Cardiologist:  Lesleigh Noe, MD (Inactive) {  History of Present Illness: .   Olivia Erickson is a 89 y.o. female with a history of permanent atrial fibrillation and severe aortic stenosis who presents for follow-up appointment.  Was seen 10/14/2020 and further medical history includes chronic atrial fibrillation, type 2 diabetes mellitus, severe aortic stenosis, mixed hyperlipidemia, hypertensive kidney disease, coronary atherosclerosis and multivessel coronary disease with stent placement in the proximal RCA in 2019 and mid LAD and circumflex, chronic anticoagulation therapy, decreased hearing, hypertension [facility] [place].  Last seen 11/01/2021 by Dr. Katrinka Blazing.  No complaints at that appointment.  Conservative management was the key.  Today, she states that she does get some physical exercise when she walks to the dining room and back.  She also does some physical therapy.  She has a walker that she uses for assistance.  She is in the middle of switching primary care providers.  No longer with Kelli Hope, NP.  She resides at [facility] and feels supported there.  No new issues with her heart.  No chest pain or shortness of breath.  No dizziness, lightheadedness, syncope, or presyncope.  Discussed updating echocardiogram and patient prefers to wait until next year.  Reports no shortness of breath nor dyspnea on exertion. Reports no chest pain, pressure, or tightness. No edema, orthopnea, PND. Reports no palpitations.    ROS: Pertinent ROS in HPI  Studies Reviewed: .       Echocardiogram 10/05/2020 IMPRESSIONS     1. Left ventricular ejection fraction, by estimation, is 55 to 60%. The  left ventricle has normal function. The left ventricle has no  regional  wall motion abnormalities. Left ventricular diastolic function could not  be evaluated.   2. Right ventricular systolic function is normal. The right ventricular  size is normal. There is normal pulmonary artery systolic pressure.   3. The mitral valve is normal in structure. Moderate mitral valve  regurgitation. No evidence of mitral stenosis.   4. The aortic valve is tricuspid. There is moderate calcification of the  aortic valve. There is moderate thickening of the aortic valve. Aortic  valve regurgitation is not visualized. Severe aortic valve stenosis.   5. Pulmonic valve regurgitation is moderate.   FINDINGS   Left Ventricle: Left ventricular ejection fraction, by estimation, is 55  to 60%. The left ventricle has normal function. The left ventricle has no  regional wall motion abnormalities. Global longitudinal strain performed  but not reported based on  interpreter judgement due to suboptimal tracking. The left ventricular  internal cavity size was normal in size. There is no left ventricular  hypertrophy. Left ventricular diastolic function could not be evaluated  due to atrial fibrillation. Left  ventricular diastolic function could not be evaluated.   Right Ventricle: The right ventricular size is normal. No increase in  right ventricular wall thickness. Right ventricular systolic function is  normal. There is normal pulmonary artery systolic pressure. The tricuspid  regurgitant velocity is 2.70 m/s, and   with an assumed right atrial pressure of 3 mmHg, the estimated right  ventricular systolic pressure is 32.2 mmHg.   Left Atrium: Left atrial size was normal in size.   Right Atrium: Right atrial size was normal  in size.   Pericardium: There is no evidence of pericardial effusion.   Mitral Valve: The mitral valve is normal in structure. Moderate mitral  valve regurgitation. No evidence of mitral valve stenosis.   Tricuspid Valve: The tricuspid valve is  grossly normal. Tricuspid valve  regurgitation is trivial.   Aortic Valve: The aortic valve is tricuspid. There is moderate  calcification of the aortic valve. There is moderate thickening of the  aortic valve. There is mild aortic valve annular calcification. Aortic  valve regurgitation is not visualized. Severe  aortic stenosis is present. Aortic valve mean gradient measures 45.0 mmHg.  Aortic valve peak gradient measures 68.6 mmHg. Aortic valve area, by VTI  measures 0.30 cm.   Pulmonic Valve: The pulmonic valve was grossly normal. Pulmonic valve  regurgitation is moderate.   Aorta: The aortic root and ascending aorta are structurally normal, with  no evidence of dilitation.   IAS/Shunts: The atrial septum is grossly normal.      Risk Assessment/Calculations:    CHA2DS2-VASc Score = 6   This indicates a 9.7% annual risk of stroke. The patient's score is based upon: CHF History: 0 HTN History: 1 Diabetes History: 1 Stroke History: 0 Vascular Disease History: 1 Age Score: 2 Gender Score: 1      Physical Exam:   VS:  BP 114/80   Pulse 68   Ht 5\' 3"  (1.6 m)   Wt 159 lb 6.4 oz (72.3 kg)   SpO2 98%   BMI 28.24 kg/m    Wt Readings from Last 3 Encounters:  12/22/22 159 lb 6.4 oz (72.3 kg)  11/01/21 153 lb 12.8 oz (69.8 kg)  04/22/21 160 lb 9.6 oz (72.8 kg)    GEN: Well nourished, well developed in no acute distress NECK: No JVD; No carotid bruits CARDIAC: RRR, + systolic murmurs, rubs, gallops RESPIRATORY:  Clear to auscultation without rales, wheezing or rhonchi  ABDOMEN: Soft, non-tender, non-distended EXTREMITIES:  No edema; No deformity   ASSESSMENT AND PLAN: .   1.  Severe aortic stenosis -next year update echo -Continue current medicines which include Eliquis 2.5 mg twice a day, losartan 25 mg daily, metoprolol succinate 50 mg daily, Crestor 5 mg daily  2.  Coronary artery disease -No chest pain -Continue current medications -Continue to get some  physical exercise with walking around residence and physical therapy  3.  Permanent atrial fibrillation -does not notice any symptoms -Sounds regular today on exam -No major issues with Eliquis, had a bleeding hemorrhoid but that has resolved   4.  Hypertension -Continue current medication regimen -Well-controlled, 114/80 today -Continue heart healthy, low-sodium diet  5.  Type 2 diabetes mellitus -new diagnosis  -She was on metformin but switch to glipizide since her kidney functions bumped up -Will refill today since she currently is in between PCPs  6.  Chronic anticoagulation -bleeding Hemorid but cleared up      Dispo: She can follow-up in a year with Dr. Jacques Navy  Signed, Sharlene Dory, PA-C

## 2022-12-22 NOTE — Patient Instructions (Addendum)
Medication Instructions:  Your physician recommends that you continue on your current medications as directed. Please refer to the Current Medication list given to you today.  *If you need a refill on your cardiac medications before your next appointment, please call your pharmacy*  Lab Work: Have your primary care provider send [place] a copy of lab results when you have them drawn [phone] If you have labs (blood work) drawn today and your tests are completely normal, you will receive your results only by: MyChart Message (if you have MyChart) OR A paper copy in the mail If you have any lab test that is abnormal or we need to change your treatment, we will call you to review the results.  Follow-Up: At [facility], you and your health needs are our priority.  As part of our continuing mission to provide you with exceptional heart care, we have created designated Provider Care Teams.  These Care Teams include your primary Cardiologist (physician) and Advanced Practice Providers (APPs -  Physician Assistants and Nurse Practitioners) who all work together to provide you with the care you need, when you need it.  We recommend signing up for the patient portal called "MyChart".  Sign up information is provided on this After Visit Summary.  MyChart is used to connect with patients for Virtual Visits (Telemedicine).  Patients are able to view lab/test results, encounter notes, upcoming appointments, etc.  Non-urgent messages can be sent to your provider as well.   To learn more about what you can do with MyChart, go to [email or web address].    Your next appointment:   1 year(s)  Provider:   Dr Jacques Navy  Other Instructions Elevate feet when you are able  Low-Sodium Eating Plan Salt (sodium) helps you keep a healthy balance of fluids in your body. Too much sodium can raise your blood pressure. It can also cause fluid and waste to be held in your body. Your health care provider or  dietitian may recommend a low-sodium eating plan if you have high blood pressure (hypertension), kidney disease, liver disease, or heart failure. Eating less sodium can help lower your blood pressure and reduce swelling. It can also protect your heart, liver, and kidneys. What are tips for following this plan? Reading food labels  Check food labels for the amount of sodium per serving. If you eat more than one serving, you must multiply the listed amount by the number of servings. Choose foods with less than 140 milligrams (mg) of sodium per serving. Avoid foods with 300 mg of sodium or more per serving. Always check how much sodium is in a product, even if the label says "unsalted" or "no salt added." Shopping  Buy products labeled as "low-sodium" or "no salt added." Buy fresh foods. Avoid canned foods and pre-made or frozen meals. Avoid canned, cured, or processed meats. Buy breads that have less than 80 mg of sodium per slice. Cooking  Eat more home-cooked food. Try to eat less restaurant, buffet, and fast food. Try not to add salt when you cook. Use salt-free seasonings or herbs instead of table salt or sea salt. Check with your provider or pharmacist before using salt substitutes. Cook with plant-based oils, such as canola, sunflower, or olive oil. Meal planning When eating at a restaurant, ask if your food can be made with less salt or no salt. Avoid dishes labeled as brined, pickled, cured, or smoked. Avoid dishes made with soy sauce, miso, or teriyaki sauce. Avoid foods that have monosodium  glutamate (MSG) in them. MSG may be added to some restaurant food, sauces, soups, bouillon, and canned foods. Make meals that can be grilled, baked, poached, roasted, or steamed. These are often made with less sodium. General information Try to limit your sodium intake to 1,500-2,300 mg each day, or the amount told by your provider. What foods should I eat? Fruits Fresh, frozen, or canned  fruit. Fruit juice. Vegetables Fresh or frozen vegetables. "No salt added" canned vegetables. "No salt added" tomato sauce and paste. Low-sodium or reduced-sodium tomato and vegetable juice. Grains Low-sodium cereals, such as oats, puffed wheat and rice, and shredded wheat. Low-sodium crackers. Unsalted rice. Unsalted pasta. Low-sodium bread. Whole grain breads and whole grain pasta. Meats and other proteins Fresh or frozen meat, poultry, seafood, and fish. These should have no added salt. Low-sodium canned tuna and salmon. Unsalted nuts. Dried peas, beans, and lentils without added salt. Unsalted canned beans. Eggs. Unsalted nut butters. Dairy Milk. Soy milk. Cheese that is naturally low in sodium, such as ricotta cheese, fresh mozzarella, or Swiss cheese. Low-sodium or reduced-sodium cheese. Cream cheese. Yogurt. Seasonings and condiments Fresh and dried herbs and spices. Salt-free seasonings. Low-sodium mustard and ketchup. Sodium-free salad dressing. Sodium-free light mayonnaise. Fresh or refrigerated horseradish. Lemon juice. Vinegar. Other foods Homemade, reduced-sodium, or low-sodium soups. Unsalted popcorn and pretzels. Low-salt or salt-free chips. The items listed above may not be all the foods and drinks you can have. Talk to a dietitian to learn more. What foods should I avoid? Vegetables Sauerkraut, pickled vegetables, and relishes. Olives. [place] fries. Onion rings. Regular canned vegetables, except low-sodium or reduced-sodium items. Regular canned tomato sauce and paste. Regular tomato and vegetable juice. Frozen vegetables in sauces. Grains Instant hot cereals. Bread stuffing, pancake, and biscuit mixes. Croutons. Seasoned rice or pasta mixes. Noodle soup cups. Boxed or frozen macaroni and cheese. Regular salted crackers. Self-rising flour. Meats and other proteins Meat or fish that is salted, canned, smoked, spiced, or pickled. Precooked or cured meat, such as sausages or meat  loaves. Tomasa Blase. Ham. Pepperoni. Hot dogs. Corned beef. Chipped beef. Salt pork. Jerky. Pickled herring, anchovies, and sardines. Regular canned tuna. Salted nuts. Dairy Processed cheese and cheese spreads. Hard cheeses. Cheese curds. Blue cheese. Feta cheese. String cheese. Regular cottage cheese. Buttermilk. Canned milk. Fats and oils Salted butter. Regular margarine. Ghee. Bacon fat. Seasonings and condiments Onion salt, garlic salt, seasoned salt, table salt, and sea salt. Canned and packaged gravies. Worcestershire sauce. Tartar sauce. Barbecue sauce. Teriyaki sauce. Soy sauce, including reduced-sodium soy sauce. Steak sauce. Fish sauce. Oyster sauce. Cocktail sauce. Horseradish that you find on the shelf. Regular ketchup and mustard. Meat flavorings and tenderizers. Bouillon cubes. Hot sauce. Pre-made or packaged marinades. Pre-made or packaged taco seasonings. Relishes. Regular salad dressings. Salsa. Other foods Salted popcorn and pretzels. Corn chips and puffs. Potato and tortilla chips. Canned or dried soups. Pizza. Frozen entrees and pot pies. The items listed above may not be all the foods and drinks you should avoid. Talk to a dietitian to learn more. This information is not intended to replace advice given to you by your health care provider. Make sure you discuss any questions you have with your health care provider. Document Revised: 06/22/2022 Document Reviewed: 06/22/2022 Elsevier Patient Education  2024 Elsevier Inc.   Heart-Healthy Eating Plan Many factors influence your heart health, including eating and exercise habits. Heart health is also called coronary health. Coronary risk increases with abnormal blood fat (lipid) levels. A heart-healthy eating plan  includes limiting unhealthy fats, increasing healthy fats, limiting salt (sodium) intake, and making other diet and lifestyle changes. What is my plan? Your health care provider may recommend that: You limit your fat intake to  _________% or less of your total calories each day. You limit your saturated fat intake to _________% or less of your total calories each day. You limit the amount of cholesterol in your diet to less than _________ mg per day. You limit the amount of sodium in your diet to less than _________ mg per day. What are tips for following this plan? Cooking Cook foods using methods other than frying. Baking, boiling, grilling, and broiling are all good options. Other ways to reduce fat include: Removing the skin from poultry. Removing all visible fats from meats. Steaming vegetables in water or broth. Meal planning  At meals, imagine dividing your plate into fourths: Fill one-half of your plate with vegetables and green salads. Fill one-fourth of your plate with whole grains. Fill one-fourth of your plate with lean protein foods. Eat 2-4 cups of vegetables per day. One cup of vegetables equals 1 cup (91 g) broccoli or cauliflower florets, 2 medium carrots, 1 large bell pepper, 1 large sweet potato, 1 large tomato, 1 medium white potato, 2 cups (150 g) raw leafy greens. Eat 1-2 cups of fruit per day. One cup of fruit equals 1 small apple, 1 large banana, 1 cup (237 g) mixed fruit, 1 large orange,  cup (82 g) dried fruit, 1 cup (240 mL) 100% fruit juice. Eat more foods that contain soluble fiber. Examples include apples, broccoli, carrots, beans, peas, and barley. Aim to get 25-30 g of fiber per day. Increase your consumption of legumes, nuts, and seeds to 4-5 servings per week. One serving of dried beans or legumes equals  cup (90 g) cooked, 1 serving of nuts is  oz (12 almonds, 24 pistachios, or 7 walnut halves), and 1 serving of seeds equals  oz (8 g). Fats Choose healthy fats more often. Choose monounsaturated and polyunsaturated fats, such as olive and canola oils, avocado oil, flaxseeds, walnuts, almonds, and seeds. Eat more omega-3 fats. Choose salmon, mackerel, sardines, tuna, flaxseed  oil, and ground flaxseeds. Aim to eat fish at least 2 times each week. Check food labels carefully to identify foods with trans fats or high amounts of saturated fat. Limit saturated fats. These are found in animal products, such as meats, butter, and cream. Plant sources of saturated fats include palm oil, palm kernel oil, and coconut oil. Avoid foods with partially hydrogenated oils in them. These contain trans fats. Examples are stick margarine, some tub margarines, cookies, crackers, and other baked goods. Avoid fried foods. General information Eat more home-cooked food and less restaurant, buffet, and fast food. Limit or avoid alcohol. Limit foods that are high in added sugar and simple starches such as foods made using white refined flour (white breads, pastries, sweets). Lose weight if you are overweight. Losing just 5-10% of your body weight can help your overall health and prevent diseases such as diabetes and heart disease. Monitor your sodium intake, especially if you have high blood pressure. Talk with your health care provider about your sodium intake. Try to incorporate more vegetarian meals weekly. What foods should I eat? Fruits All fresh, canned (in natural juice), or frozen fruits. Vegetables Fresh or frozen vegetables (raw, steamed, roasted, or grilled). Green salads. Grains Most grains. Choose whole wheat and whole grains most of the time. Rice and pasta, including  brown rice and pastas made with whole wheat. Meats and other proteins Lean, well-trimmed beef, veal, pork, and lamb. Chicken and [place] without skin. All fish and shellfish. Wild duck, rabbit, pheasant, and venison. Egg whites or low-cholesterol egg substitutes. Dried beans, peas, lentils, and tofu. Seeds and most nuts. Dairy Low-fat or nonfat cheeses, including ricotta and mozzarella. Skim or 1% milk (liquid, powdered, or evaporated). Buttermilk made with low-fat milk. Nonfat or low-fat yogurt. Fats and  oils Non-hydrogenated (trans-free) margarines. Vegetable oils, including soybean, sesame, sunflower, olive, avocado, peanut, safflower, corn, canola, and cottonseed. Salad dressings or mayonnaise made with a vegetable oil. Beverages Water (mineral or sparkling). Coffee and tea. Unsweetened ice tea. Diet beverages. Sweets and desserts Sherbet, gelatin, and fruit ice. Small amounts of dark chocolate. Limit all sweets and desserts. Seasonings and condiments All seasonings and condiments. The items listed above may not be a complete list of foods and beverages you can eat. Contact a dietitian for more options. What foods should I avoid? Fruits Canned fruit in heavy syrup. Fruit in cream or butter sauce. Fried fruit. Limit coconut. Vegetables Vegetables cooked in cheese, cream, or butter sauce. Fried vegetables. Grains Breads made with saturated or trans fats, oils, or whole milk. Croissants. Sweet rolls. Donuts. High-fat crackers, such as cheese crackers and chips. Meats and other proteins Fatty meats, such as hot dogs, ribs, sausage, bacon, rib-eye roast or steak. High-fat deli meats, such as salami and bologna. Caviar. Domestic duck and goose. Organ meats, such as liver. Dairy Cream, sour cream, cream cheese, and creamed cottage cheese. Whole-milk cheeses. Whole or 2% milk (liquid, evaporated, or condensed). Whole buttermilk. Cream sauce or high-fat cheese sauce. Whole-milk yogurt. Fats and oils Meat fat, or shortening. Cocoa butter, hydrogenated oils, palm oil, coconut oil, palm kernel oil. Solid fats and shortenings, including bacon fat, salt pork, lard, and butter. Nondairy cream substitutes. Salad dressings with cheese or sour cream. Beverages Regular sodas and any drinks with added sugar. Sweets and desserts Frosting. Pudding. Cookies. Cakes. Pies. Milk chocolate or white chocolate. Buttered syrups. Full-fat ice cream or ice cream drinks. The items listed above may not be a complete  list of foods and beverages to avoid. Contact a dietitian for more information. Summary Heart-healthy meal planning includes limiting unhealthy fats, increasing healthy fats, limiting salt (sodium) intake and making other diet and lifestyle changes. Lose weight if you are overweight. Losing just 5-10% of your body weight can help your overall health and prevent diseases such as diabetes and heart disease. Focus on eating a balance of foods, including fruits and vegetables, low-fat or nonfat dairy, lean protein, nuts and legumes, whole grains, and heart-healthy oils and fats. This information is not intended to replace advice given to you by your health care provider. Make sure you discuss any questions you have with your health care provider. Document Revised: 07/11/2021 Document Reviewed: 07/11/2021 Elsevier Patient Education  2024 [employer].

## 2022-12-25 ENCOUNTER — Telehealth: Payer: Self-pay | Admitting: Physician Assistant

## 2022-12-25 NOTE — Telephone Encounter (Signed)
Left a message to call back.

## 2022-12-25 NOTE — Telephone Encounter (Signed)
Spoke with pt daughter Juliette Alcide ok per DPR.  She advised me that pt is no longer taking metformin and to remove from med list. I have removed medication. Daughter was under the impression that [facility] would refill Glipizide.  Advised that this was a 1 time fill and will need to f/u with [facility] for future refills.   Also reports allopurinol is 50 mg PO every day.  Advised there is a note pt is taking differently within 100 mg order.    No further concerns at this time.

## 2022-12-25 NOTE — Telephone Encounter (Signed)
Pt would like a callback from [place] regarding medication list. Please advise.

## 2022-12-29 ENCOUNTER — Other Ambulatory Visit: Payer: Self-pay

## 2022-12-29 MED ORDER — METOPROLOL SUCCINATE ER 50 MG PO TB24: 50.0000 mg | ORAL_TABLET | Freq: Every day | ORAL | 3 refills | Status: DC

## 2023-01-15 DIAGNOSIS — L89151 Pressure ulcer of sacral region, stage 1: Secondary | ICD-10-CM | POA: Diagnosis not present

## 2023-01-15 DIAGNOSIS — I129 Hypertensive chronic kidney disease with stage 1 through stage 4 chronic kidney disease, or unspecified chronic kidney disease: Secondary | ICD-10-CM | POA: Diagnosis not present

## 2023-01-15 DIAGNOSIS — I480 Paroxysmal atrial fibrillation: Secondary | ICD-10-CM | POA: Diagnosis not present

## 2023-01-15 DIAGNOSIS — N184 Chronic kidney disease, stage 4 (severe): Secondary | ICD-10-CM | POA: Diagnosis not present

## 2023-01-18 DIAGNOSIS — E559 Vitamin D deficiency, unspecified: Secondary | ICD-10-CM | POA: Diagnosis not present

## 2023-01-18 DIAGNOSIS — I480 Paroxysmal atrial fibrillation: Secondary | ICD-10-CM | POA: Diagnosis not present

## 2023-01-18 DIAGNOSIS — I129 Hypertensive chronic kidney disease with stage 1 through stage 4 chronic kidney disease, or unspecified chronic kidney disease: Secondary | ICD-10-CM | POA: Diagnosis not present

## 2023-01-18 DIAGNOSIS — Z79899 Other long term (current) drug therapy: Secondary | ICD-10-CM | POA: Diagnosis not present

## 2023-01-18 DIAGNOSIS — E1122 Type 2 diabetes mellitus with diabetic chronic kidney disease: Secondary | ICD-10-CM | POA: Diagnosis not present

## 2023-01-26 DIAGNOSIS — E119 Type 2 diabetes mellitus without complications: Secondary | ICD-10-CM | POA: Diagnosis not present

## 2023-01-26 DIAGNOSIS — I10 Essential (primary) hypertension: Secondary | ICD-10-CM | POA: Diagnosis not present

## 2023-01-29 DIAGNOSIS — K080 Exfoliation of teeth due to systemic causes: Secondary | ICD-10-CM | POA: Diagnosis not present

## 2023-01-30 DIAGNOSIS — E119 Type 2 diabetes mellitus without complications: Secondary | ICD-10-CM | POA: Diagnosis not present

## 2023-01-30 DIAGNOSIS — I10 Essential (primary) hypertension: Secondary | ICD-10-CM | POA: Diagnosis not present

## 2023-03-07 DIAGNOSIS — E1122 Type 2 diabetes mellitus with diabetic chronic kidney disease: Secondary | ICD-10-CM | POA: Diagnosis not present

## 2023-03-07 DIAGNOSIS — N184 Chronic kidney disease, stage 4 (severe): Secondary | ICD-10-CM | POA: Diagnosis not present

## 2023-03-07 DIAGNOSIS — E782 Mixed hyperlipidemia: Secondary | ICD-10-CM | POA: Diagnosis not present

## 2023-03-07 DIAGNOSIS — I129 Hypertensive chronic kidney disease with stage 1 through stage 4 chronic kidney disease, or unspecified chronic kidney disease: Secondary | ICD-10-CM | POA: Diagnosis not present

## 2023-04-30 DIAGNOSIS — Z7901 Long term (current) use of anticoagulants: Secondary | ICD-10-CM | POA: Diagnosis not present

## 2023-04-30 DIAGNOSIS — N184 Chronic kidney disease, stage 4 (severe): Secondary | ICD-10-CM | POA: Diagnosis not present

## 2023-04-30 DIAGNOSIS — I129 Hypertensive chronic kidney disease with stage 1 through stage 4 chronic kidney disease, or unspecified chronic kidney disease: Secondary | ICD-10-CM | POA: Diagnosis not present

## 2023-04-30 DIAGNOSIS — I480 Paroxysmal atrial fibrillation: Secondary | ICD-10-CM | POA: Diagnosis not present

## 2023-05-16 DIAGNOSIS — I10 Essential (primary) hypertension: Secondary | ICD-10-CM | POA: Diagnosis not present

## 2023-05-16 DIAGNOSIS — E785 Hyperlipidemia, unspecified: Secondary | ICD-10-CM | POA: Diagnosis not present

## 2023-06-05 DIAGNOSIS — E785 Hyperlipidemia, unspecified: Secondary | ICD-10-CM | POA: Diagnosis not present

## 2023-06-05 DIAGNOSIS — I10 Essential (primary) hypertension: Secondary | ICD-10-CM | POA: Diagnosis not present

## 2023-06-07 DIAGNOSIS — E1122 Type 2 diabetes mellitus with diabetic chronic kidney disease: Secondary | ICD-10-CM | POA: Diagnosis not present

## 2023-06-07 DIAGNOSIS — R600 Localized edema: Secondary | ICD-10-CM | POA: Diagnosis not present

## 2023-06-07 DIAGNOSIS — N184 Chronic kidney disease, stage 4 (severe): Secondary | ICD-10-CM | POA: Diagnosis not present

## 2023-06-07 DIAGNOSIS — I129 Hypertensive chronic kidney disease with stage 1 through stage 4 chronic kidney disease, or unspecified chronic kidney disease: Secondary | ICD-10-CM | POA: Diagnosis not present

## 2023-07-02 DIAGNOSIS — Z7901 Long term (current) use of anticoagulants: Secondary | ICD-10-CM | POA: Diagnosis not present

## 2023-07-02 DIAGNOSIS — N184 Chronic kidney disease, stage 4 (severe): Secondary | ICD-10-CM | POA: Diagnosis not present

## 2023-07-02 DIAGNOSIS — I480 Paroxysmal atrial fibrillation: Secondary | ICD-10-CM | POA: Diagnosis not present

## 2023-07-02 DIAGNOSIS — I129 Hypertensive chronic kidney disease with stage 1 through stage 4 chronic kidney disease, or unspecified chronic kidney disease: Secondary | ICD-10-CM | POA: Diagnosis not present

## 2023-07-19 DIAGNOSIS — N189 Chronic kidney disease, unspecified: Secondary | ICD-10-CM | POA: Diagnosis not present

## 2023-07-19 DIAGNOSIS — K080 Exfoliation of teeth due to systemic causes: Secondary | ICD-10-CM | POA: Diagnosis not present

## 2023-07-19 DIAGNOSIS — N184 Chronic kidney disease, stage 4 (severe): Secondary | ICD-10-CM | POA: Diagnosis not present

## 2023-07-23 DIAGNOSIS — N184 Chronic kidney disease, stage 4 (severe): Secondary | ICD-10-CM | POA: Diagnosis not present

## 2023-07-23 DIAGNOSIS — R2689 Other abnormalities of gait and mobility: Secondary | ICD-10-CM | POA: Diagnosis not present

## 2023-07-23 DIAGNOSIS — R296 Repeated falls: Secondary | ICD-10-CM | POA: Diagnosis not present

## 2023-07-23 DIAGNOSIS — I129 Hypertensive chronic kidney disease with stage 1 through stage 4 chronic kidney disease, or unspecified chronic kidney disease: Secondary | ICD-10-CM | POA: Diagnosis not present

## 2023-07-23 DIAGNOSIS — Z7901 Long term (current) use of anticoagulants: Secondary | ICD-10-CM | POA: Diagnosis not present

## 2023-07-23 DIAGNOSIS — I480 Paroxysmal atrial fibrillation: Secondary | ICD-10-CM | POA: Diagnosis not present

## 2023-07-26 DIAGNOSIS — R296 Repeated falls: Secondary | ICD-10-CM | POA: Diagnosis not present

## 2023-07-26 DIAGNOSIS — R2689 Other abnormalities of gait and mobility: Secondary | ICD-10-CM | POA: Diagnosis not present

## 2023-08-01 DIAGNOSIS — R296 Repeated falls: Secondary | ICD-10-CM | POA: Diagnosis not present

## 2023-08-01 DIAGNOSIS — R2689 Other abnormalities of gait and mobility: Secondary | ICD-10-CM | POA: Diagnosis not present

## 2023-08-03 DIAGNOSIS — R296 Repeated falls: Secondary | ICD-10-CM | POA: Diagnosis not present

## 2023-08-03 DIAGNOSIS — R2689 Other abnormalities of gait and mobility: Secondary | ICD-10-CM | POA: Diagnosis not present

## 2023-08-06 DIAGNOSIS — R2689 Other abnormalities of gait and mobility: Secondary | ICD-10-CM | POA: Diagnosis not present

## 2023-08-06 DIAGNOSIS — R296 Repeated falls: Secondary | ICD-10-CM | POA: Diagnosis not present

## 2023-08-08 DIAGNOSIS — R2689 Other abnormalities of gait and mobility: Secondary | ICD-10-CM | POA: Diagnosis not present

## 2023-08-08 DIAGNOSIS — R296 Repeated falls: Secondary | ICD-10-CM | POA: Diagnosis not present

## 2023-08-13 DIAGNOSIS — R2689 Other abnormalities of gait and mobility: Secondary | ICD-10-CM | POA: Diagnosis not present

## 2023-08-13 DIAGNOSIS — R296 Repeated falls: Secondary | ICD-10-CM | POA: Diagnosis not present

## 2023-08-15 DIAGNOSIS — R2689 Other abnormalities of gait and mobility: Secondary | ICD-10-CM | POA: Diagnosis not present

## 2023-08-15 DIAGNOSIS — R296 Repeated falls: Secondary | ICD-10-CM | POA: Diagnosis not present

## 2023-08-21 DIAGNOSIS — R2689 Other abnormalities of gait and mobility: Secondary | ICD-10-CM | POA: Diagnosis not present

## 2023-08-21 DIAGNOSIS — R296 Repeated falls: Secondary | ICD-10-CM | POA: Diagnosis not present

## 2023-08-22 DIAGNOSIS — R2689 Other abnormalities of gait and mobility: Secondary | ICD-10-CM | POA: Diagnosis not present

## 2023-08-22 DIAGNOSIS — R296 Repeated falls: Secondary | ICD-10-CM | POA: Diagnosis not present

## 2023-08-27 DIAGNOSIS — R2689 Other abnormalities of gait and mobility: Secondary | ICD-10-CM | POA: Diagnosis not present

## 2023-08-27 DIAGNOSIS — R296 Repeated falls: Secondary | ICD-10-CM | POA: Diagnosis not present

## 2023-08-31 DIAGNOSIS — R2689 Other abnormalities of gait and mobility: Secondary | ICD-10-CM | POA: Diagnosis not present

## 2023-08-31 DIAGNOSIS — R296 Repeated falls: Secondary | ICD-10-CM | POA: Diagnosis not present

## 2023-09-05 DIAGNOSIS — R296 Repeated falls: Secondary | ICD-10-CM | POA: Diagnosis not present

## 2023-09-05 DIAGNOSIS — R2689 Other abnormalities of gait and mobility: Secondary | ICD-10-CM | POA: Diagnosis not present

## 2023-09-07 DIAGNOSIS — R296 Repeated falls: Secondary | ICD-10-CM | POA: Diagnosis not present

## 2023-09-07 DIAGNOSIS — R2689 Other abnormalities of gait and mobility: Secondary | ICD-10-CM | POA: Diagnosis not present

## 2023-09-10 DIAGNOSIS — R2689 Other abnormalities of gait and mobility: Secondary | ICD-10-CM | POA: Diagnosis not present

## 2023-09-10 DIAGNOSIS — R296 Repeated falls: Secondary | ICD-10-CM | POA: Diagnosis not present

## 2023-09-12 DIAGNOSIS — R296 Repeated falls: Secondary | ICD-10-CM | POA: Diagnosis not present

## 2023-09-12 DIAGNOSIS — R2689 Other abnormalities of gait and mobility: Secondary | ICD-10-CM | POA: Diagnosis not present

## 2023-09-18 DIAGNOSIS — R296 Repeated falls: Secondary | ICD-10-CM | POA: Diagnosis not present

## 2023-09-18 DIAGNOSIS — R2689 Other abnormalities of gait and mobility: Secondary | ICD-10-CM | POA: Diagnosis not present

## 2023-09-20 DIAGNOSIS — R2689 Other abnormalities of gait and mobility: Secondary | ICD-10-CM | POA: Diagnosis not present

## 2023-09-20 DIAGNOSIS — R296 Repeated falls: Secondary | ICD-10-CM | POA: Diagnosis not present

## 2023-09-24 DIAGNOSIS — R296 Repeated falls: Secondary | ICD-10-CM | POA: Diagnosis not present

## 2023-09-24 DIAGNOSIS — R2689 Other abnormalities of gait and mobility: Secondary | ICD-10-CM | POA: Diagnosis not present

## 2023-09-26 DIAGNOSIS — I4819 Other persistent atrial fibrillation: Secondary | ICD-10-CM | POA: Diagnosis not present

## 2023-09-26 DIAGNOSIS — I130 Hypertensive heart and chronic kidney disease with heart failure and stage 1 through stage 4 chronic kidney disease, or unspecified chronic kidney disease: Secondary | ICD-10-CM | POA: Diagnosis not present

## 2023-09-26 DIAGNOSIS — E1122 Type 2 diabetes mellitus with diabetic chronic kidney disease: Secondary | ICD-10-CM | POA: Diagnosis not present

## 2023-09-26 DIAGNOSIS — I5041 Acute combined systolic (congestive) and diastolic (congestive) heart failure: Secondary | ICD-10-CM | POA: Diagnosis not present

## 2023-09-27 ENCOUNTER — Other Ambulatory Visit: Payer: Self-pay

## 2023-09-27 MED ORDER — LOSARTAN POTASSIUM 25 MG PO TABS: 25.0000 mg | ORAL_TABLET | Freq: Every day | ORAL | 0 refills | Status: DC

## 2023-11-08 ENCOUNTER — Encounter: Payer: Self-pay | Admitting: Internal Medicine

## 2023-11-10 DIAGNOSIS — I252 Old myocardial infarction: Secondary | ICD-10-CM | POA: Diagnosis not present

## 2023-11-10 DIAGNOSIS — R32 Unspecified urinary incontinence: Secondary | ICD-10-CM | POA: Diagnosis not present

## 2023-11-10 DIAGNOSIS — E1140 Type 2 diabetes mellitus with diabetic neuropathy, unspecified: Secondary | ICD-10-CM | POA: Diagnosis not present

## 2023-11-10 DIAGNOSIS — I350 Nonrheumatic aortic (valve) stenosis: Secondary | ICD-10-CM | POA: Diagnosis not present

## 2023-11-10 DIAGNOSIS — Z7901 Long term (current) use of anticoagulants: Secondary | ICD-10-CM | POA: Diagnosis not present

## 2023-11-10 DIAGNOSIS — M8580 Other specified disorders of bone density and structure, unspecified site: Secondary | ICD-10-CM | POA: Diagnosis not present

## 2023-11-10 DIAGNOSIS — E1122 Type 2 diabetes mellitus with diabetic chronic kidney disease: Secondary | ICD-10-CM | POA: Diagnosis not present

## 2023-11-10 DIAGNOSIS — D689 Coagulation defect, unspecified: Secondary | ICD-10-CM | POA: Diagnosis not present

## 2023-11-10 DIAGNOSIS — L89321 Pressure ulcer of left buttock, stage 1: Secondary | ICD-10-CM | POA: Diagnosis not present

## 2023-11-10 DIAGNOSIS — Z556 Problems related to health literacy: Secondary | ICD-10-CM | POA: Diagnosis not present

## 2023-11-10 DIAGNOSIS — H539 Unspecified visual disturbance: Secondary | ICD-10-CM | POA: Diagnosis not present

## 2023-11-10 DIAGNOSIS — H6120 Impacted cerumen, unspecified ear: Secondary | ICD-10-CM | POA: Diagnosis not present

## 2023-11-10 DIAGNOSIS — M109 Gout, unspecified: Secondary | ICD-10-CM | POA: Diagnosis not present

## 2023-11-10 DIAGNOSIS — E785 Hyperlipidemia, unspecified: Secondary | ICD-10-CM | POA: Diagnosis not present

## 2023-11-10 DIAGNOSIS — N184 Chronic kidney disease, stage 4 (severe): Secondary | ICD-10-CM | POA: Diagnosis not present

## 2023-11-10 DIAGNOSIS — Z7984 Long term (current) use of oral hypoglycemic drugs: Secondary | ICD-10-CM | POA: Diagnosis not present

## 2023-11-10 DIAGNOSIS — L89311 Pressure ulcer of right buttock, stage 1: Secondary | ICD-10-CM | POA: Diagnosis not present

## 2023-11-10 DIAGNOSIS — I480 Paroxysmal atrial fibrillation: Secondary | ICD-10-CM | POA: Diagnosis not present

## 2023-11-10 DIAGNOSIS — Z955 Presence of coronary angioplasty implant and graft: Secondary | ICD-10-CM | POA: Diagnosis not present

## 2023-11-10 DIAGNOSIS — I1310 Hypertensive heart and chronic kidney disease without heart failure, with stage 1 through stage 4 chronic kidney disease, or unspecified chronic kidney disease: Secondary | ICD-10-CM | POA: Diagnosis not present

## 2023-11-15 DIAGNOSIS — R32 Unspecified urinary incontinence: Secondary | ICD-10-CM | POA: Diagnosis not present

## 2023-11-15 DIAGNOSIS — Z556 Problems related to health literacy: Secondary | ICD-10-CM | POA: Diagnosis not present

## 2023-11-15 DIAGNOSIS — Z7984 Long term (current) use of oral hypoglycemic drugs: Secondary | ICD-10-CM | POA: Diagnosis not present

## 2023-11-15 DIAGNOSIS — Z7901 Long term (current) use of anticoagulants: Secondary | ICD-10-CM | POA: Diagnosis not present

## 2023-11-15 DIAGNOSIS — I1310 Hypertensive heart and chronic kidney disease without heart failure, with stage 1 through stage 4 chronic kidney disease, or unspecified chronic kidney disease: Secondary | ICD-10-CM | POA: Diagnosis not present

## 2023-11-15 DIAGNOSIS — E1122 Type 2 diabetes mellitus with diabetic chronic kidney disease: Secondary | ICD-10-CM | POA: Diagnosis not present

## 2023-11-15 DIAGNOSIS — Z955 Presence of coronary angioplasty implant and graft: Secondary | ICD-10-CM | POA: Diagnosis not present

## 2023-11-15 DIAGNOSIS — E785 Hyperlipidemia, unspecified: Secondary | ICD-10-CM | POA: Diagnosis not present

## 2023-11-15 DIAGNOSIS — N184 Chronic kidney disease, stage 4 (severe): Secondary | ICD-10-CM | POA: Diagnosis not present

## 2023-11-15 DIAGNOSIS — D689 Coagulation defect, unspecified: Secondary | ICD-10-CM | POA: Diagnosis not present

## 2023-11-15 DIAGNOSIS — M8580 Other specified disorders of bone density and structure, unspecified site: Secondary | ICD-10-CM | POA: Diagnosis not present

## 2023-11-15 DIAGNOSIS — L89321 Pressure ulcer of left buttock, stage 1: Secondary | ICD-10-CM | POA: Diagnosis not present

## 2023-11-15 DIAGNOSIS — H539 Unspecified visual disturbance: Secondary | ICD-10-CM | POA: Diagnosis not present

## 2023-11-15 DIAGNOSIS — L89311 Pressure ulcer of right buttock, stage 1: Secondary | ICD-10-CM | POA: Diagnosis not present

## 2023-11-15 DIAGNOSIS — H6120 Impacted cerumen, unspecified ear: Secondary | ICD-10-CM | POA: Diagnosis not present

## 2023-11-15 DIAGNOSIS — E1140 Type 2 diabetes mellitus with diabetic neuropathy, unspecified: Secondary | ICD-10-CM | POA: Diagnosis not present

## 2023-11-15 DIAGNOSIS — I480 Paroxysmal atrial fibrillation: Secondary | ICD-10-CM | POA: Diagnosis not present

## 2023-11-15 DIAGNOSIS — I252 Old myocardial infarction: Secondary | ICD-10-CM | POA: Diagnosis not present

## 2023-11-15 DIAGNOSIS — M109 Gout, unspecified: Secondary | ICD-10-CM | POA: Diagnosis not present

## 2023-11-15 DIAGNOSIS — I350 Nonrheumatic aortic (valve) stenosis: Secondary | ICD-10-CM | POA: Diagnosis not present

## 2023-11-20 DIAGNOSIS — H6120 Impacted cerumen, unspecified ear: Secondary | ICD-10-CM | POA: Diagnosis not present

## 2023-11-20 DIAGNOSIS — Z7901 Long term (current) use of anticoagulants: Secondary | ICD-10-CM | POA: Diagnosis not present

## 2023-11-20 DIAGNOSIS — Z955 Presence of coronary angioplasty implant and graft: Secondary | ICD-10-CM | POA: Diagnosis not present

## 2023-11-20 DIAGNOSIS — L89311 Pressure ulcer of right buttock, stage 1: Secondary | ICD-10-CM | POA: Diagnosis not present

## 2023-11-20 DIAGNOSIS — R32 Unspecified urinary incontinence: Secondary | ICD-10-CM | POA: Diagnosis not present

## 2023-11-20 DIAGNOSIS — Z7984 Long term (current) use of oral hypoglycemic drugs: Secondary | ICD-10-CM | POA: Diagnosis not present

## 2023-11-20 DIAGNOSIS — Z556 Problems related to health literacy: Secondary | ICD-10-CM | POA: Diagnosis not present

## 2023-11-20 DIAGNOSIS — I350 Nonrheumatic aortic (valve) stenosis: Secondary | ICD-10-CM | POA: Diagnosis not present

## 2023-11-20 DIAGNOSIS — D689 Coagulation defect, unspecified: Secondary | ICD-10-CM | POA: Diagnosis not present

## 2023-11-20 DIAGNOSIS — L89321 Pressure ulcer of left buttock, stage 1: Secondary | ICD-10-CM | POA: Diagnosis not present

## 2023-11-20 DIAGNOSIS — I480 Paroxysmal atrial fibrillation: Secondary | ICD-10-CM | POA: Diagnosis not present

## 2023-11-20 DIAGNOSIS — E1140 Type 2 diabetes mellitus with diabetic neuropathy, unspecified: Secondary | ICD-10-CM | POA: Diagnosis not present

## 2023-11-20 DIAGNOSIS — M109 Gout, unspecified: Secondary | ICD-10-CM | POA: Diagnosis not present

## 2023-11-20 DIAGNOSIS — M8580 Other specified disorders of bone density and structure, unspecified site: Secondary | ICD-10-CM | POA: Diagnosis not present

## 2023-11-20 DIAGNOSIS — E785 Hyperlipidemia, unspecified: Secondary | ICD-10-CM | POA: Diagnosis not present

## 2023-11-20 DIAGNOSIS — N184 Chronic kidney disease, stage 4 (severe): Secondary | ICD-10-CM | POA: Diagnosis not present

## 2023-11-20 DIAGNOSIS — E1122 Type 2 diabetes mellitus with diabetic chronic kidney disease: Secondary | ICD-10-CM | POA: Diagnosis not present

## 2023-11-20 DIAGNOSIS — I1310 Hypertensive heart and chronic kidney disease without heart failure, with stage 1 through stage 4 chronic kidney disease, or unspecified chronic kidney disease: Secondary | ICD-10-CM | POA: Diagnosis not present

## 2023-11-20 DIAGNOSIS — H539 Unspecified visual disturbance: Secondary | ICD-10-CM | POA: Diagnosis not present

## 2023-11-20 DIAGNOSIS — I252 Old myocardial infarction: Secondary | ICD-10-CM | POA: Diagnosis not present

## 2023-11-23 DIAGNOSIS — R32 Unspecified urinary incontinence: Secondary | ICD-10-CM | POA: Diagnosis not present

## 2023-11-23 DIAGNOSIS — E1122 Type 2 diabetes mellitus with diabetic chronic kidney disease: Secondary | ICD-10-CM | POA: Diagnosis not present

## 2023-11-23 DIAGNOSIS — E785 Hyperlipidemia, unspecified: Secondary | ICD-10-CM | POA: Diagnosis not present

## 2023-11-23 DIAGNOSIS — H539 Unspecified visual disturbance: Secondary | ICD-10-CM | POA: Diagnosis not present

## 2023-11-23 DIAGNOSIS — I1310 Hypertensive heart and chronic kidney disease without heart failure, with stage 1 through stage 4 chronic kidney disease, or unspecified chronic kidney disease: Secondary | ICD-10-CM | POA: Diagnosis not present

## 2023-11-23 DIAGNOSIS — L89321 Pressure ulcer of left buttock, stage 1: Secondary | ICD-10-CM | POA: Diagnosis not present

## 2023-11-23 DIAGNOSIS — I252 Old myocardial infarction: Secondary | ICD-10-CM | POA: Diagnosis not present

## 2023-11-23 DIAGNOSIS — Z556 Problems related to health literacy: Secondary | ICD-10-CM | POA: Diagnosis not present

## 2023-11-23 DIAGNOSIS — M109 Gout, unspecified: Secondary | ICD-10-CM | POA: Diagnosis not present

## 2023-11-23 DIAGNOSIS — E1140 Type 2 diabetes mellitus with diabetic neuropathy, unspecified: Secondary | ICD-10-CM | POA: Diagnosis not present

## 2023-11-23 DIAGNOSIS — I130 Hypertensive heart and chronic kidney disease with heart failure and stage 1 through stage 4 chronic kidney disease, or unspecified chronic kidney disease: Secondary | ICD-10-CM | POA: Diagnosis not present

## 2023-11-23 DIAGNOSIS — D689 Coagulation defect, unspecified: Secondary | ICD-10-CM | POA: Diagnosis not present

## 2023-11-23 DIAGNOSIS — M8580 Other specified disorders of bone density and structure, unspecified site: Secondary | ICD-10-CM | POA: Diagnosis not present

## 2023-11-23 DIAGNOSIS — H6120 Impacted cerumen, unspecified ear: Secondary | ICD-10-CM | POA: Diagnosis not present

## 2023-11-23 DIAGNOSIS — Z7984 Long term (current) use of oral hypoglycemic drugs: Secondary | ICD-10-CM | POA: Diagnosis not present

## 2023-11-23 DIAGNOSIS — Z7901 Long term (current) use of anticoagulants: Secondary | ICD-10-CM | POA: Diagnosis not present

## 2023-11-23 DIAGNOSIS — L89311 Pressure ulcer of right buttock, stage 1: Secondary | ICD-10-CM | POA: Diagnosis not present

## 2023-11-23 DIAGNOSIS — I480 Paroxysmal atrial fibrillation: Secondary | ICD-10-CM | POA: Diagnosis not present

## 2023-11-23 DIAGNOSIS — N184 Chronic kidney disease, stage 4 (severe): Secondary | ICD-10-CM | POA: Diagnosis not present

## 2023-11-23 DIAGNOSIS — Z955 Presence of coronary angioplasty implant and graft: Secondary | ICD-10-CM | POA: Diagnosis not present

## 2023-11-23 DIAGNOSIS — I350 Nonrheumatic aortic (valve) stenosis: Secondary | ICD-10-CM | POA: Diagnosis not present

## 2023-11-24 ENCOUNTER — Other Ambulatory Visit: Payer: Self-pay | Admitting: Physician Assistant

## 2023-11-27 DIAGNOSIS — I350 Nonrheumatic aortic (valve) stenosis: Secondary | ICD-10-CM | POA: Diagnosis not present

## 2023-11-27 DIAGNOSIS — L89311 Pressure ulcer of right buttock, stage 1: Secondary | ICD-10-CM | POA: Diagnosis not present

## 2023-11-27 DIAGNOSIS — H539 Unspecified visual disturbance: Secondary | ICD-10-CM | POA: Diagnosis not present

## 2023-11-27 DIAGNOSIS — D689 Coagulation defect, unspecified: Secondary | ICD-10-CM | POA: Diagnosis not present

## 2023-11-27 DIAGNOSIS — L89321 Pressure ulcer of left buttock, stage 1: Secondary | ICD-10-CM | POA: Diagnosis not present

## 2023-11-27 DIAGNOSIS — Z7901 Long term (current) use of anticoagulants: Secondary | ICD-10-CM | POA: Diagnosis not present

## 2023-11-27 DIAGNOSIS — E785 Hyperlipidemia, unspecified: Secondary | ICD-10-CM | POA: Diagnosis not present

## 2023-11-27 DIAGNOSIS — H6120 Impacted cerumen, unspecified ear: Secondary | ICD-10-CM | POA: Diagnosis not present

## 2023-11-27 DIAGNOSIS — N184 Chronic kidney disease, stage 4 (severe): Secondary | ICD-10-CM | POA: Diagnosis not present

## 2023-11-27 DIAGNOSIS — E1122 Type 2 diabetes mellitus with diabetic chronic kidney disease: Secondary | ICD-10-CM | POA: Diagnosis not present

## 2023-11-27 DIAGNOSIS — Z955 Presence of coronary angioplasty implant and graft: Secondary | ICD-10-CM | POA: Diagnosis not present

## 2023-11-27 DIAGNOSIS — I252 Old myocardial infarction: Secondary | ICD-10-CM | POA: Diagnosis not present

## 2023-11-27 DIAGNOSIS — I480 Paroxysmal atrial fibrillation: Secondary | ICD-10-CM | POA: Diagnosis not present

## 2023-11-27 DIAGNOSIS — Z7984 Long term (current) use of oral hypoglycemic drugs: Secondary | ICD-10-CM | POA: Diagnosis not present

## 2023-11-27 DIAGNOSIS — R32 Unspecified urinary incontinence: Secondary | ICD-10-CM | POA: Diagnosis not present

## 2023-11-27 DIAGNOSIS — I1310 Hypertensive heart and chronic kidney disease without heart failure, with stage 1 through stage 4 chronic kidney disease, or unspecified chronic kidney disease: Secondary | ICD-10-CM | POA: Diagnosis not present

## 2023-11-27 DIAGNOSIS — M8580 Other specified disorders of bone density and structure, unspecified site: Secondary | ICD-10-CM | POA: Diagnosis not present

## 2023-11-27 DIAGNOSIS — E1140 Type 2 diabetes mellitus with diabetic neuropathy, unspecified: Secondary | ICD-10-CM | POA: Diagnosis not present

## 2023-11-27 DIAGNOSIS — M109 Gout, unspecified: Secondary | ICD-10-CM | POA: Diagnosis not present

## 2023-11-27 DIAGNOSIS — Z556 Problems related to health literacy: Secondary | ICD-10-CM | POA: Diagnosis not present

## 2023-11-29 DIAGNOSIS — H539 Unspecified visual disturbance: Secondary | ICD-10-CM | POA: Diagnosis not present

## 2023-11-29 DIAGNOSIS — Z955 Presence of coronary angioplasty implant and graft: Secondary | ICD-10-CM | POA: Diagnosis not present

## 2023-11-29 DIAGNOSIS — H6120 Impacted cerumen, unspecified ear: Secondary | ICD-10-CM | POA: Diagnosis not present

## 2023-11-29 DIAGNOSIS — Z556 Problems related to health literacy: Secondary | ICD-10-CM | POA: Diagnosis not present

## 2023-11-29 DIAGNOSIS — R32 Unspecified urinary incontinence: Secondary | ICD-10-CM | POA: Diagnosis not present

## 2023-11-29 DIAGNOSIS — I252 Old myocardial infarction: Secondary | ICD-10-CM | POA: Diagnosis not present

## 2023-11-29 DIAGNOSIS — I350 Nonrheumatic aortic (valve) stenosis: Secondary | ICD-10-CM | POA: Diagnosis not present

## 2023-11-29 DIAGNOSIS — M8580 Other specified disorders of bone density and structure, unspecified site: Secondary | ICD-10-CM | POA: Diagnosis not present

## 2023-11-29 DIAGNOSIS — I1310 Hypertensive heart and chronic kidney disease without heart failure, with stage 1 through stage 4 chronic kidney disease, or unspecified chronic kidney disease: Secondary | ICD-10-CM | POA: Diagnosis not present

## 2023-11-29 DIAGNOSIS — E1140 Type 2 diabetes mellitus with diabetic neuropathy, unspecified: Secondary | ICD-10-CM | POA: Diagnosis not present

## 2023-11-29 DIAGNOSIS — M109 Gout, unspecified: Secondary | ICD-10-CM | POA: Diagnosis not present

## 2023-11-29 DIAGNOSIS — Z7984 Long term (current) use of oral hypoglycemic drugs: Secondary | ICD-10-CM | POA: Diagnosis not present

## 2023-11-29 DIAGNOSIS — E1122 Type 2 diabetes mellitus with diabetic chronic kidney disease: Secondary | ICD-10-CM | POA: Diagnosis not present

## 2023-11-29 DIAGNOSIS — L89321 Pressure ulcer of left buttock, stage 1: Secondary | ICD-10-CM | POA: Diagnosis not present

## 2023-11-29 DIAGNOSIS — L89311 Pressure ulcer of right buttock, stage 1: Secondary | ICD-10-CM | POA: Diagnosis not present

## 2023-11-29 DIAGNOSIS — E785 Hyperlipidemia, unspecified: Secondary | ICD-10-CM | POA: Diagnosis not present

## 2023-11-29 DIAGNOSIS — I480 Paroxysmal atrial fibrillation: Secondary | ICD-10-CM | POA: Diagnosis not present

## 2023-11-29 DIAGNOSIS — Z7901 Long term (current) use of anticoagulants: Secondary | ICD-10-CM | POA: Diagnosis not present

## 2023-11-29 DIAGNOSIS — D689 Coagulation defect, unspecified: Secondary | ICD-10-CM | POA: Diagnosis not present

## 2023-11-29 DIAGNOSIS — N184 Chronic kidney disease, stage 4 (severe): Secondary | ICD-10-CM | POA: Diagnosis not present

## 2023-12-03 DIAGNOSIS — I252 Old myocardial infarction: Secondary | ICD-10-CM | POA: Diagnosis not present

## 2023-12-03 DIAGNOSIS — E1140 Type 2 diabetes mellitus with diabetic neuropathy, unspecified: Secondary | ICD-10-CM | POA: Diagnosis not present

## 2023-12-03 DIAGNOSIS — Z7984 Long term (current) use of oral hypoglycemic drugs: Secondary | ICD-10-CM | POA: Diagnosis not present

## 2023-12-03 DIAGNOSIS — Z955 Presence of coronary angioplasty implant and graft: Secondary | ICD-10-CM | POA: Diagnosis not present

## 2023-12-03 DIAGNOSIS — E1122 Type 2 diabetes mellitus with diabetic chronic kidney disease: Secondary | ICD-10-CM | POA: Diagnosis not present

## 2023-12-03 DIAGNOSIS — L89321 Pressure ulcer of left buttock, stage 1: Secondary | ICD-10-CM | POA: Diagnosis not present

## 2023-12-03 DIAGNOSIS — H539 Unspecified visual disturbance: Secondary | ICD-10-CM | POA: Diagnosis not present

## 2023-12-03 DIAGNOSIS — M8580 Other specified disorders of bone density and structure, unspecified site: Secondary | ICD-10-CM | POA: Diagnosis not present

## 2023-12-03 DIAGNOSIS — N184 Chronic kidney disease, stage 4 (severe): Secondary | ICD-10-CM | POA: Diagnosis not present

## 2023-12-03 DIAGNOSIS — R32 Unspecified urinary incontinence: Secondary | ICD-10-CM | POA: Diagnosis not present

## 2023-12-03 DIAGNOSIS — I1310 Hypertensive heart and chronic kidney disease without heart failure, with stage 1 through stage 4 chronic kidney disease, or unspecified chronic kidney disease: Secondary | ICD-10-CM | POA: Diagnosis not present

## 2023-12-03 DIAGNOSIS — D689 Coagulation defect, unspecified: Secondary | ICD-10-CM | POA: Diagnosis not present

## 2023-12-03 DIAGNOSIS — E785 Hyperlipidemia, unspecified: Secondary | ICD-10-CM | POA: Diagnosis not present

## 2023-12-03 DIAGNOSIS — I350 Nonrheumatic aortic (valve) stenosis: Secondary | ICD-10-CM | POA: Diagnosis not present

## 2023-12-03 DIAGNOSIS — L89311 Pressure ulcer of right buttock, stage 1: Secondary | ICD-10-CM | POA: Diagnosis not present

## 2023-12-03 DIAGNOSIS — I480 Paroxysmal atrial fibrillation: Secondary | ICD-10-CM | POA: Diagnosis not present

## 2023-12-03 DIAGNOSIS — Z556 Problems related to health literacy: Secondary | ICD-10-CM | POA: Diagnosis not present

## 2023-12-03 DIAGNOSIS — H6120 Impacted cerumen, unspecified ear: Secondary | ICD-10-CM | POA: Diagnosis not present

## 2023-12-03 DIAGNOSIS — M109 Gout, unspecified: Secondary | ICD-10-CM | POA: Diagnosis not present

## 2023-12-03 DIAGNOSIS — Z7901 Long term (current) use of anticoagulants: Secondary | ICD-10-CM | POA: Diagnosis not present

## 2023-12-04 DIAGNOSIS — L89311 Pressure ulcer of right buttock, stage 1: Secondary | ICD-10-CM | POA: Diagnosis not present

## 2023-12-04 DIAGNOSIS — I480 Paroxysmal atrial fibrillation: Secondary | ICD-10-CM | POA: Diagnosis not present

## 2023-12-04 DIAGNOSIS — L89321 Pressure ulcer of left buttock, stage 1: Secondary | ICD-10-CM | POA: Diagnosis not present

## 2023-12-10 DIAGNOSIS — L89321 Pressure ulcer of left buttock, stage 1: Secondary | ICD-10-CM | POA: Diagnosis not present

## 2023-12-10 DIAGNOSIS — R32 Unspecified urinary incontinence: Secondary | ICD-10-CM | POA: Diagnosis not present

## 2023-12-10 DIAGNOSIS — H6120 Impacted cerumen, unspecified ear: Secondary | ICD-10-CM | POA: Diagnosis not present

## 2023-12-10 DIAGNOSIS — Z7984 Long term (current) use of oral hypoglycemic drugs: Secondary | ICD-10-CM | POA: Diagnosis not present

## 2023-12-10 DIAGNOSIS — N184 Chronic kidney disease, stage 4 (severe): Secondary | ICD-10-CM | POA: Diagnosis not present

## 2023-12-10 DIAGNOSIS — I1310 Hypertensive heart and chronic kidney disease without heart failure, with stage 1 through stage 4 chronic kidney disease, or unspecified chronic kidney disease: Secondary | ICD-10-CM | POA: Diagnosis not present

## 2023-12-10 DIAGNOSIS — I252 Old myocardial infarction: Secondary | ICD-10-CM | POA: Diagnosis not present

## 2023-12-10 DIAGNOSIS — I480 Paroxysmal atrial fibrillation: Secondary | ICD-10-CM | POA: Diagnosis not present

## 2023-12-10 DIAGNOSIS — L89311 Pressure ulcer of right buttock, stage 1: Secondary | ICD-10-CM | POA: Diagnosis not present

## 2023-12-10 DIAGNOSIS — H539 Unspecified visual disturbance: Secondary | ICD-10-CM | POA: Diagnosis not present

## 2023-12-10 DIAGNOSIS — M8580 Other specified disorders of bone density and structure, unspecified site: Secondary | ICD-10-CM | POA: Diagnosis not present

## 2023-12-10 DIAGNOSIS — Z955 Presence of coronary angioplasty implant and graft: Secondary | ICD-10-CM | POA: Diagnosis not present

## 2023-12-10 DIAGNOSIS — Z556 Problems related to health literacy: Secondary | ICD-10-CM | POA: Diagnosis not present

## 2023-12-10 DIAGNOSIS — D689 Coagulation defect, unspecified: Secondary | ICD-10-CM | POA: Diagnosis not present

## 2023-12-10 DIAGNOSIS — E785 Hyperlipidemia, unspecified: Secondary | ICD-10-CM | POA: Diagnosis not present

## 2023-12-10 DIAGNOSIS — E1140 Type 2 diabetes mellitus with diabetic neuropathy, unspecified: Secondary | ICD-10-CM | POA: Diagnosis not present

## 2023-12-10 DIAGNOSIS — M109 Gout, unspecified: Secondary | ICD-10-CM | POA: Diagnosis not present

## 2023-12-10 DIAGNOSIS — E1122 Type 2 diabetes mellitus with diabetic chronic kidney disease: Secondary | ICD-10-CM | POA: Diagnosis not present

## 2023-12-10 DIAGNOSIS — I350 Nonrheumatic aortic (valve) stenosis: Secondary | ICD-10-CM | POA: Diagnosis not present

## 2023-12-10 DIAGNOSIS — Z7901 Long term (current) use of anticoagulants: Secondary | ICD-10-CM | POA: Diagnosis not present

## 2023-12-12 DIAGNOSIS — E1140 Type 2 diabetes mellitus with diabetic neuropathy, unspecified: Secondary | ICD-10-CM | POA: Diagnosis not present

## 2023-12-12 DIAGNOSIS — L89311 Pressure ulcer of right buttock, stage 1: Secondary | ICD-10-CM | POA: Diagnosis not present

## 2023-12-12 DIAGNOSIS — I252 Old myocardial infarction: Secondary | ICD-10-CM | POA: Diagnosis not present

## 2023-12-12 DIAGNOSIS — Z955 Presence of coronary angioplasty implant and graft: Secondary | ICD-10-CM | POA: Diagnosis not present

## 2023-12-12 DIAGNOSIS — H6120 Impacted cerumen, unspecified ear: Secondary | ICD-10-CM | POA: Diagnosis not present

## 2023-12-12 DIAGNOSIS — M109 Gout, unspecified: Secondary | ICD-10-CM | POA: Diagnosis not present

## 2023-12-12 DIAGNOSIS — I350 Nonrheumatic aortic (valve) stenosis: Secondary | ICD-10-CM | POA: Diagnosis not present

## 2023-12-12 DIAGNOSIS — D689 Coagulation defect, unspecified: Secondary | ICD-10-CM | POA: Diagnosis not present

## 2023-12-12 DIAGNOSIS — M8580 Other specified disorders of bone density and structure, unspecified site: Secondary | ICD-10-CM | POA: Diagnosis not present

## 2023-12-12 DIAGNOSIS — R32 Unspecified urinary incontinence: Secondary | ICD-10-CM | POA: Diagnosis not present

## 2023-12-12 DIAGNOSIS — Z7901 Long term (current) use of anticoagulants: Secondary | ICD-10-CM | POA: Diagnosis not present

## 2023-12-12 DIAGNOSIS — I1310 Hypertensive heart and chronic kidney disease without heart failure, with stage 1 through stage 4 chronic kidney disease, or unspecified chronic kidney disease: Secondary | ICD-10-CM | POA: Diagnosis not present

## 2023-12-12 DIAGNOSIS — N184 Chronic kidney disease, stage 4 (severe): Secondary | ICD-10-CM | POA: Diagnosis not present

## 2023-12-12 DIAGNOSIS — E1122 Type 2 diabetes mellitus with diabetic chronic kidney disease: Secondary | ICD-10-CM | POA: Diagnosis not present

## 2023-12-12 DIAGNOSIS — H539 Unspecified visual disturbance: Secondary | ICD-10-CM | POA: Diagnosis not present

## 2023-12-12 DIAGNOSIS — I480 Paroxysmal atrial fibrillation: Secondary | ICD-10-CM | POA: Diagnosis not present

## 2023-12-12 DIAGNOSIS — L89321 Pressure ulcer of left buttock, stage 1: Secondary | ICD-10-CM | POA: Diagnosis not present

## 2023-12-12 DIAGNOSIS — E785 Hyperlipidemia, unspecified: Secondary | ICD-10-CM | POA: Diagnosis not present

## 2023-12-12 DIAGNOSIS — Z7984 Long term (current) use of oral hypoglycemic drugs: Secondary | ICD-10-CM | POA: Diagnosis not present

## 2023-12-12 DIAGNOSIS — Z556 Problems related to health literacy: Secondary | ICD-10-CM | POA: Diagnosis not present

## 2023-12-18 DIAGNOSIS — I252 Old myocardial infarction: Secondary | ICD-10-CM | POA: Diagnosis not present

## 2023-12-18 DIAGNOSIS — H539 Unspecified visual disturbance: Secondary | ICD-10-CM | POA: Diagnosis not present

## 2023-12-18 DIAGNOSIS — Z7901 Long term (current) use of anticoagulants: Secondary | ICD-10-CM | POA: Diagnosis not present

## 2023-12-18 DIAGNOSIS — R32 Unspecified urinary incontinence: Secondary | ICD-10-CM | POA: Diagnosis not present

## 2023-12-18 DIAGNOSIS — H6120 Impacted cerumen, unspecified ear: Secondary | ICD-10-CM | POA: Diagnosis not present

## 2023-12-18 DIAGNOSIS — Z556 Problems related to health literacy: Secondary | ICD-10-CM | POA: Diagnosis not present

## 2023-12-18 DIAGNOSIS — M109 Gout, unspecified: Secondary | ICD-10-CM | POA: Diagnosis not present

## 2023-12-18 DIAGNOSIS — Z955 Presence of coronary angioplasty implant and graft: Secondary | ICD-10-CM | POA: Diagnosis not present

## 2023-12-18 DIAGNOSIS — L89321 Pressure ulcer of left buttock, stage 1: Secondary | ICD-10-CM | POA: Diagnosis not present

## 2023-12-18 DIAGNOSIS — D689 Coagulation defect, unspecified: Secondary | ICD-10-CM | POA: Diagnosis not present

## 2023-12-18 DIAGNOSIS — I350 Nonrheumatic aortic (valve) stenosis: Secondary | ICD-10-CM | POA: Diagnosis not present

## 2023-12-18 DIAGNOSIS — E1140 Type 2 diabetes mellitus with diabetic neuropathy, unspecified: Secondary | ICD-10-CM | POA: Diagnosis not present

## 2023-12-18 DIAGNOSIS — E1122 Type 2 diabetes mellitus with diabetic chronic kidney disease: Secondary | ICD-10-CM | POA: Diagnosis not present

## 2023-12-18 DIAGNOSIS — M8580 Other specified disorders of bone density and structure, unspecified site: Secondary | ICD-10-CM | POA: Diagnosis not present

## 2023-12-18 DIAGNOSIS — I480 Paroxysmal atrial fibrillation: Secondary | ICD-10-CM | POA: Diagnosis not present

## 2023-12-18 DIAGNOSIS — N184 Chronic kidney disease, stage 4 (severe): Secondary | ICD-10-CM | POA: Diagnosis not present

## 2023-12-18 DIAGNOSIS — E785 Hyperlipidemia, unspecified: Secondary | ICD-10-CM | POA: Diagnosis not present

## 2023-12-18 DIAGNOSIS — Z7984 Long term (current) use of oral hypoglycemic drugs: Secondary | ICD-10-CM | POA: Diagnosis not present

## 2023-12-18 DIAGNOSIS — I1310 Hypertensive heart and chronic kidney disease without heart failure, with stage 1 through stage 4 chronic kidney disease, or unspecified chronic kidney disease: Secondary | ICD-10-CM | POA: Diagnosis not present

## 2023-12-18 DIAGNOSIS — L89311 Pressure ulcer of right buttock, stage 1: Secondary | ICD-10-CM | POA: Diagnosis not present

## 2023-12-25 ENCOUNTER — Other Ambulatory Visit: Payer: Self-pay

## 2023-12-25 MED ORDER — LOSARTAN POTASSIUM 25 MG PO TABS: 25.0000 mg | ORAL_TABLET | Freq: Every day | ORAL | 0 refills | Status: DC

## 2023-12-27 DIAGNOSIS — I252 Old myocardial infarction: Secondary | ICD-10-CM | POA: Diagnosis not present

## 2023-12-27 DIAGNOSIS — I480 Paroxysmal atrial fibrillation: Secondary | ICD-10-CM | POA: Diagnosis not present

## 2023-12-27 DIAGNOSIS — Z7901 Long term (current) use of anticoagulants: Secondary | ICD-10-CM | POA: Diagnosis not present

## 2023-12-27 DIAGNOSIS — M8580 Other specified disorders of bone density and structure, unspecified site: Secondary | ICD-10-CM | POA: Diagnosis not present

## 2023-12-27 DIAGNOSIS — Z955 Presence of coronary angioplasty implant and graft: Secondary | ICD-10-CM | POA: Diagnosis not present

## 2023-12-27 DIAGNOSIS — L89321 Pressure ulcer of left buttock, stage 1: Secondary | ICD-10-CM | POA: Diagnosis not present

## 2023-12-27 DIAGNOSIS — D689 Coagulation defect, unspecified: Secondary | ICD-10-CM | POA: Diagnosis not present

## 2023-12-27 DIAGNOSIS — H6120 Impacted cerumen, unspecified ear: Secondary | ICD-10-CM | POA: Diagnosis not present

## 2023-12-27 DIAGNOSIS — E1122 Type 2 diabetes mellitus with diabetic chronic kidney disease: Secondary | ICD-10-CM | POA: Diagnosis not present

## 2023-12-27 DIAGNOSIS — E1140 Type 2 diabetes mellitus with diabetic neuropathy, unspecified: Secondary | ICD-10-CM | POA: Diagnosis not present

## 2023-12-27 DIAGNOSIS — I1310 Hypertensive heart and chronic kidney disease without heart failure, with stage 1 through stage 4 chronic kidney disease, or unspecified chronic kidney disease: Secondary | ICD-10-CM | POA: Diagnosis not present

## 2023-12-27 DIAGNOSIS — N184 Chronic kidney disease, stage 4 (severe): Secondary | ICD-10-CM | POA: Diagnosis not present

## 2023-12-27 DIAGNOSIS — Z7984 Long term (current) use of oral hypoglycemic drugs: Secondary | ICD-10-CM | POA: Diagnosis not present

## 2023-12-27 DIAGNOSIS — Z556 Problems related to health literacy: Secondary | ICD-10-CM | POA: Diagnosis not present

## 2023-12-27 DIAGNOSIS — M109 Gout, unspecified: Secondary | ICD-10-CM | POA: Diagnosis not present

## 2023-12-27 DIAGNOSIS — R32 Unspecified urinary incontinence: Secondary | ICD-10-CM | POA: Diagnosis not present

## 2023-12-27 DIAGNOSIS — H539 Unspecified visual disturbance: Secondary | ICD-10-CM | POA: Diagnosis not present

## 2023-12-27 DIAGNOSIS — E785 Hyperlipidemia, unspecified: Secondary | ICD-10-CM | POA: Diagnosis not present

## 2023-12-27 DIAGNOSIS — L89311 Pressure ulcer of right buttock, stage 1: Secondary | ICD-10-CM | POA: Diagnosis not present

## 2023-12-27 DIAGNOSIS — I350 Nonrheumatic aortic (valve) stenosis: Secondary | ICD-10-CM | POA: Diagnosis not present

## 2024-01-04 DIAGNOSIS — N184 Chronic kidney disease, stage 4 (severe): Secondary | ICD-10-CM | POA: Diagnosis not present

## 2024-01-04 DIAGNOSIS — E1140 Type 2 diabetes mellitus with diabetic neuropathy, unspecified: Secondary | ICD-10-CM | POA: Diagnosis not present

## 2024-01-04 DIAGNOSIS — Z7901 Long term (current) use of anticoagulants: Secondary | ICD-10-CM | POA: Diagnosis not present

## 2024-01-04 DIAGNOSIS — M8580 Other specified disorders of bone density and structure, unspecified site: Secondary | ICD-10-CM | POA: Diagnosis not present

## 2024-01-04 DIAGNOSIS — I252 Old myocardial infarction: Secondary | ICD-10-CM | POA: Diagnosis not present

## 2024-01-04 DIAGNOSIS — Z955 Presence of coronary angioplasty implant and graft: Secondary | ICD-10-CM | POA: Diagnosis not present

## 2024-01-04 DIAGNOSIS — I350 Nonrheumatic aortic (valve) stenosis: Secondary | ICD-10-CM | POA: Diagnosis not present

## 2024-01-04 DIAGNOSIS — E1122 Type 2 diabetes mellitus with diabetic chronic kidney disease: Secondary | ICD-10-CM | POA: Diagnosis not present

## 2024-01-04 DIAGNOSIS — M109 Gout, unspecified: Secondary | ICD-10-CM | POA: Diagnosis not present

## 2024-01-04 DIAGNOSIS — L89311 Pressure ulcer of right buttock, stage 1: Secondary | ICD-10-CM | POA: Diagnosis not present

## 2024-01-04 DIAGNOSIS — H539 Unspecified visual disturbance: Secondary | ICD-10-CM | POA: Diagnosis not present

## 2024-01-04 DIAGNOSIS — D689 Coagulation defect, unspecified: Secondary | ICD-10-CM | POA: Diagnosis not present

## 2024-01-04 DIAGNOSIS — L89321 Pressure ulcer of left buttock, stage 1: Secondary | ICD-10-CM | POA: Diagnosis not present

## 2024-01-04 DIAGNOSIS — I480 Paroxysmal atrial fibrillation: Secondary | ICD-10-CM | POA: Diagnosis not present

## 2024-01-04 DIAGNOSIS — R32 Unspecified urinary incontinence: Secondary | ICD-10-CM | POA: Diagnosis not present

## 2024-01-04 DIAGNOSIS — Z7984 Long term (current) use of oral hypoglycemic drugs: Secondary | ICD-10-CM | POA: Diagnosis not present

## 2024-01-04 DIAGNOSIS — E785 Hyperlipidemia, unspecified: Secondary | ICD-10-CM | POA: Diagnosis not present

## 2024-01-04 DIAGNOSIS — Z556 Problems related to health literacy: Secondary | ICD-10-CM | POA: Diagnosis not present

## 2024-01-04 DIAGNOSIS — I1310 Hypertensive heart and chronic kidney disease without heart failure, with stage 1 through stage 4 chronic kidney disease, or unspecified chronic kidney disease: Secondary | ICD-10-CM | POA: Diagnosis not present

## 2024-01-04 DIAGNOSIS — H6120 Impacted cerumen, unspecified ear: Secondary | ICD-10-CM | POA: Diagnosis not present

## 2024-01-09 DIAGNOSIS — I130 Hypertensive heart and chronic kidney disease with heart failure and stage 1 through stage 4 chronic kidney disease, or unspecified chronic kidney disease: Secondary | ICD-10-CM | POA: Diagnosis not present

## 2024-01-09 DIAGNOSIS — I509 Heart failure, unspecified: Secondary | ICD-10-CM | POA: Diagnosis not present

## 2024-01-09 DIAGNOSIS — I480 Paroxysmal atrial fibrillation: Secondary | ICD-10-CM | POA: Diagnosis not present

## 2024-01-09 DIAGNOSIS — N184 Chronic kidney disease, stage 4 (severe): Secondary | ICD-10-CM | POA: Diagnosis not present

## 2024-01-17 DIAGNOSIS — K080 Exfoliation of teeth due to systemic causes: Secondary | ICD-10-CM | POA: Diagnosis not present

## 2024-02-11 DIAGNOSIS — I480 Paroxysmal atrial fibrillation: Secondary | ICD-10-CM | POA: Diagnosis not present

## 2024-02-11 DIAGNOSIS — I1310 Hypertensive heart and chronic kidney disease without heart failure, with stage 1 through stage 4 chronic kidney disease, or unspecified chronic kidney disease: Secondary | ICD-10-CM | POA: Diagnosis not present

## 2024-02-11 DIAGNOSIS — N184 Chronic kidney disease, stage 4 (severe): Secondary | ICD-10-CM | POA: Diagnosis not present

## 2024-02-11 DIAGNOSIS — E1122 Type 2 diabetes mellitus with diabetic chronic kidney disease: Secondary | ICD-10-CM | POA: Diagnosis not present

## 2024-02-14 DIAGNOSIS — I509 Heart failure, unspecified: Secondary | ICD-10-CM | POA: Diagnosis not present

## 2024-02-14 DIAGNOSIS — I10 Essential (primary) hypertension: Secondary | ICD-10-CM | POA: Diagnosis not present

## 2024-04-20 ENCOUNTER — Observation Stay (HOSPITAL_COMMUNITY)
Admission: EM | Admit: 2024-04-20 | Discharge: 2024-05-19 | Disposition: E | Attending: Internal Medicine | Admitting: Internal Medicine

## 2024-04-20 ENCOUNTER — Emergency Department (HOSPITAL_COMMUNITY)

## 2024-04-20 ENCOUNTER — Other Ambulatory Visit: Payer: Self-pay

## 2024-04-20 ENCOUNTER — Encounter (HOSPITAL_COMMUNITY): Admission: EM | Disposition: E | Payer: Self-pay | Source: Home / Self Care | Attending: Emergency Medicine

## 2024-04-20 ENCOUNTER — Encounter (HOSPITAL_COMMUNITY): Payer: Self-pay

## 2024-04-20 DIAGNOSIS — I2111 ST elevation (STEMI) myocardial infarction involving right coronary artery: Secondary | ICD-10-CM

## 2024-04-20 DIAGNOSIS — I2510 Atherosclerotic heart disease of native coronary artery without angina pectoris: Secondary | ICD-10-CM | POA: Insufficient documentation

## 2024-04-20 DIAGNOSIS — R0989 Other specified symptoms and signs involving the circulatory and respiratory systems: Secondary | ICD-10-CM | POA: Diagnosis not present

## 2024-04-20 DIAGNOSIS — I213 ST elevation (STEMI) myocardial infarction of unspecified site: Principal | ICD-10-CM | POA: Diagnosis present

## 2024-04-20 DIAGNOSIS — N1831 Chronic kidney disease, stage 3a: Secondary | ICD-10-CM | POA: Insufficient documentation

## 2024-04-20 DIAGNOSIS — Z515 Encounter for palliative care: Secondary | ICD-10-CM | POA: Insufficient documentation

## 2024-04-20 DIAGNOSIS — I4821 Permanent atrial fibrillation: Secondary | ICD-10-CM | POA: Diagnosis not present

## 2024-04-20 DIAGNOSIS — I129 Hypertensive chronic kidney disease with stage 1 through stage 4 chronic kidney disease, or unspecified chronic kidney disease: Secondary | ICD-10-CM | POA: Diagnosis not present

## 2024-04-20 DIAGNOSIS — R410 Disorientation, unspecified: Secondary | ICD-10-CM | POA: Diagnosis not present

## 2024-04-20 DIAGNOSIS — I350 Nonrheumatic aortic (valve) stenosis: Secondary | ICD-10-CM | POA: Insufficient documentation

## 2024-04-20 DIAGNOSIS — R230 Cyanosis: Secondary | ICD-10-CM | POA: Diagnosis not present

## 2024-04-20 DIAGNOSIS — J90 Pleural effusion, not elsewhere classified: Secondary | ICD-10-CM | POA: Diagnosis not present

## 2024-04-20 DIAGNOSIS — Z79899 Other long term (current) drug therapy: Secondary | ICD-10-CM | POA: Insufficient documentation

## 2024-04-20 DIAGNOSIS — Z955 Presence of coronary angioplasty implant and graft: Secondary | ICD-10-CM | POA: Insufficient documentation

## 2024-04-20 DIAGNOSIS — R079 Chest pain, unspecified: Secondary | ICD-10-CM | POA: Diagnosis not present

## 2024-04-20 DIAGNOSIS — R231 Pallor: Secondary | ICD-10-CM | POA: Diagnosis not present

## 2024-04-20 DIAGNOSIS — R0602 Shortness of breath: Secondary | ICD-10-CM | POA: Diagnosis not present

## 2024-04-20 DIAGNOSIS — R918 Other nonspecific abnormal finding of lung field: Secondary | ICD-10-CM | POA: Diagnosis not present

## 2024-04-20 LAB — I-STAT CHEM 8, ED
BUN: 19 mg/dL (ref 8–23)
Calcium, Ion: 0.88 mmol/L — CL (ref 1.15–1.40)
Chloride: 108 mmol/L (ref 98–111)
Creatinine, Ser: 1.1 mg/dL — ABNORMAL HIGH (ref 0.44–1.00)
Glucose, Bld: 275 mg/dL — ABNORMAL HIGH (ref 70–99)
HCT: 36 % (ref 36.0–46.0)
Hemoglobin: 12.2 g/dL (ref 12.0–15.0)
Potassium: 3.9 mmol/L (ref 3.5–5.1)
Sodium: 141 mmol/L (ref 135–145)
TCO2: 16 mmol/L — ABNORMAL LOW (ref 22–32)

## 2024-04-20 LAB — TROPONIN I (HIGH SENSITIVITY): Troponin I (High Sensitivity): 5774 ng/L (ref <18)

## 2024-04-20 SURGERY — CORONARY/GRAFT ACUTE MI REVASCULARIZATION
Anesthesia: LOCAL

## 2024-04-20 MED ORDER — HYDROMORPHONE HCL-NACL 50-0.9 MG/50ML-% IV SOLN: 0.0000 mg/h | INTRAVENOUS | Status: DC

## 2024-04-20 MED ORDER — GLYCOPYRROLATE 1 MG PO TABS: 1.0000 mg | ORAL_TABLET | ORAL | Status: DC | PRN

## 2024-04-20 MED ORDER — HYDROMORPHONE BOLUS VIA INFUSION: 1.0000 mg | INTRAVENOUS | Status: DC | PRN

## 2024-04-20 MED ORDER — ACETAMINOPHEN 325 MG PO TABS: 650.0000 mg | ORAL_TABLET | Freq: Four times a day (QID) | ORAL | Status: DC | PRN

## 2024-04-20 MED ORDER — GLYCOPYRROLATE 0.2 MG/ML IJ SOLN: 0.2000 mg | INTRAMUSCULAR | Status: DC | PRN

## 2024-04-20 MED ORDER — ACETAMINOPHEN 650 MG RE SUPP: 650.0000 mg | Freq: Four times a day (QID) | RECTAL | Status: DC | PRN

## 2024-04-20 MED ORDER — SODIUM CHLORIDE 0.9 % IV SOLN: INTRAVENOUS | Status: DC

## 2024-04-20 MED ORDER — HALOPERIDOL LACTATE 5 MG/ML IJ SOLN: 2.5000 mg | INTRAMUSCULAR | Status: DC | PRN

## 2024-04-20 MED ORDER — POLYVINYL ALCOHOL 1.4 % OP SOLN: 1.0000 [drp] | Freq: Four times a day (QID) | OPHTHALMIC | Status: DC | PRN

## 2024-04-29 ENCOUNTER — Telehealth: Payer: Self-pay | Admitting: Physician Assistant

## 2024-04-29 NOTE — Telephone Encounter (Signed)
 [employer] to ask if Olivia Erickson could sign a death certificate for pt. She passed in the ED 05-08-24 and they have struggling to get someone to sign. Please advise.   [name] [phone]

## 2024-05-02 NOTE — Telephone Encounter (Signed)
 I spoke with the funeral services to get the death certificate signed and the last provider to have seen her was Orren Fabry. She was not able to sign the certificate because she was not in the database, but Dr. Lavona agreed to do it. I called the funeral services back and they stated that the medical examiner signed just that morning so there was no need for Dr. Lavona to do so.

## 2024-05-19 NOTE — Death Summary Note (Cosign Needed)
  Name: KELBIE MORO MRN: [id number] DOB: 08/15/1923 89 y.o.  Date of Admission: 08-May-2024  8:59 AM Date of Discharge: 05-08-2024 Attending Physician: Rosan Dayton BROCKS, DO  Discharge Diagnosis: Principal Problem:   STEMI (ST elevation myocardial infarction) (HCC) CAD Hypertension Permanent atrial fibrillation Aortic Stenosis, Severe  Cause of death: NSTEMI Time of death: 05/16/1006  Disposition and follow-up:   Ms.Raia MAKHIA VOSLER was discharged from [facility] in expired condition.    Hospital Course: NANCYJO GIVHAN is a 89 y.o. female with PMH of dementia, severe aortic stenosis, multivessel coronary artery disease with previous PCI of the RCA, left circumflex, and LAD, CKD stage III AA, permanent atrial fibrillation, dementia, and DNR status who was found to be in altered state at her independent living care center.   Received ASA and normal saline and albuterol in EMS. Daughter, healthcare POA, at bedside who confirmed comfort care measures only.    ED Course: EKG: personally reviewed my interpretation is acute STEMI.  Labs significant for troponin 5774 Imaging CXR: interstitial edema, small bilateral pleural effusions Received nothing Consulted Cards and IMTS   Cardiology evaluated patient and decided that emergent coronary angiogram with potential PCI, would not be of benefit for this patient. Patients HR was 40 when we arrived and decreasing. Pulse and blood pressure continued to decrease, eventually became asystolic. Patient was in ED with her daughter at bedside when she was pronounced dead at 15.   SignedBETHA Charmayne Holmes, DO 2024-05-08, 11:26 AM

## 2024-05-19 NOTE — ED Notes (Signed)
 RN and NT unable to obtain oral or axillary temp, MD notified.

## 2024-05-19 NOTE — ED Triage Notes (Addendum)
 Staff checked on patient, pt was [facility], lethargic, no radial pulses on left side. Tachypneic. HX of dementia. EMS gave 324mg  of aspirin and 300cc of NS, and 5mg  of albuterol. Hypotensive on arrival.

## 2024-05-19 NOTE — Progress Notes (Signed)
   04-25-24 9071  Spiritual Encounters  Type of Visit Initial  Care provided to: Pt and family  Reason for visit Code  OnCall Visit Yes   Chaplain responded to Code STEMI page which was later canceled. Chaplain provided spiritual care and emotional support to Patient (who is transitioning to comfort care) and Patient's daughter and daughter's husband. Daughter and her husband were in church when they received a call about the patient being at the hospital. Daughter is at peace with her mother being on comfort care. Chaplain told patient's daughter and daughter's husband that chaplain is available anytime upon request.   Chaplain Therisa Samuel

## 2024-05-19 NOTE — H&P (Cosign Needed Addendum)
 Date: 2024/05/16               Patient Name:  Olivia Erickson MRN: [id number]  DOB: 1923/12/27 Age / Sex: 89 y.o., female   PCP: Oneita Charmaine Agent, NP         Medical Service: Internal Medicine Teaching Service         Attending Physician:       First Contact: Viktoria King, DO    Second Contact: Dr. Damien Lease, DO         Pager Information: First Contact Pager: [phone]   Second Contact Pager: [phone]   SUBJECTIVE   Chief Complaint: chest pain  History of Present Illness: Olivia Erickson is a 89 y.o. female with PMH of dementia, severe aortic stenosis, multivessel coronary artery disease with previous PCI of the RCA, left circumflex, and LAD, CKD stage III AA, permanent atrial fibrillation, dementia, and DNR status who was found to be in altered state at her independent living care center.   Daughter, healthcare POA, at bedside.   ED Course: EKG: personally reviewed my interpretation is acute STEMI.  Labs significant for troponin 5774 Imaging CXR: interstitial edema, small bilateral pleural effusions Received nothing Consulted Cards and IMTS  Cardiology evaluated patient and decided that emergent coronary angiogram with potential PCI, would not be of benefit for this patient. Daughter at bedside when we arrived and she confirmed comfort measures. Patients HR was 40 when we arrived and decreasing. Daughter said her mom wasn't in any pain.   No outpatient medications have been marked as taking for the May 16, 2024 encounter [facility] Encounter).    Past Medical History dementia, severe aortic stenosis, multivessel coronary artery disease with previous PCI of the RCA, left circumflex, and LAD, CKD stage III AA, permanent atrial fibrillation, dementia  Past Surgical History History reviewed. No pertinent surgical history.   Family History:  Family History  Problem Relation Age of Onset   Heart attack Mother    Heart disease Mother    Heart attack Father    Heart  disease Father      Allergies: Allergies as of 16-May-2024   (No Known Allergies)    Review of Systems: A complete ROS was negative except as per HPI.   OBJECTIVE:   Physical Exam: Blood pressure (!) 143/88, pulse 91, temperature (!) 96.4 F (35.8 C), temperature source Temporal, resp. rate (!) 34, SpO2 100%. *these vitals were at 0905* Constitutional: appears asleep, eyes closed, mouth open, neck extended HENT: normocephalic atraumatic, mucous membranes dry Eyes: closed Cardiovascular: bradycardic Pulmonary/Chest: bradypnea  Neurological: not awake Skin: cold, BL distal extremities mottled   Labs: CBC    Component Value Date/Time   WBC 13.0 (H) 08/15/2020 1200   RBC 4.92 08/15/2020 1200   HGB 12.2 2024-05-16 0926   HCT 36.0 May 16, 2024 0926   PLT 227 08/15/2020 1200   MCV 87.6 08/15/2020 1200   MCH 28.9 08/15/2020 1200   MCHC 32.9 08/15/2020 1200   RDW 13.5 08/15/2020 1200   LYMPHSABS 1.4 08/15/2020 1200   MONOABS 0.8 08/15/2020 1200   EOSABS 0.1 08/15/2020 1200   BASOSABS 0.0 08/15/2020 1200     CMP     Component Value Date/Time   NA 141 May 16, 2024 0926   K 3.9 2024-05-16 0926   CL 108 05/16/24 0926   CO2 24 08/15/2020 1409   GLUCOSE 275 (H) 2024-05-16 0926   BUN 19 May 16, 2024 0926   CREATININE 1.10 (H) 05/16/2024 0926   CALCIUM  9.5 08/15/2020 1409  PROT 6.2 (L) 08/15/2020 1409   ALBUMIN 3.8 08/15/2020 1409   AST 19 08/15/2020 1409   ALT 16 08/15/2020 1409   ALKPHOS 46 08/15/2020 1409   BILITOT 2.0 (H) 08/15/2020 1409   GFRNONAA 53 (L) 08/15/2020 1409    Imaging:  DG Chest Port 1 View Result Date: 05/11/2024 EXAM: 1 VIEW XRAY OF THE CHEST 11-May-2024 09:40:00 AM COMPARISON: None available. CLINICAL HISTORY: Stemi FINDINGS: LUNGS AND PLEURA: Low lung volumes with diffuse interstitial prominence. Small bilateral pleural effusions. HEART AND MEDIASTINUM: Heart size at upper limits of normal. Aortic atherosclerosis. BONES AND SOFT TISSUES: Elevated  right hemidiaphragm. IMPRESSION: 1. New diffuse interstitial infiltrates, consistent with interstitial edema. 2. Small bilateral pleural effusions. Electronically signed by: Norleen Kil MD 2024-05-11 10:01 AM EST RP Workstation: [id number]      ASSESSMENT & PLAN:   Assessment & Plan by Problem: Principal Problem:   STEMI (ST elevation myocardial infarction) (HCC)   Olivia Erickson is a 89 y.o. person who presented with STEMI from assisted living facility and peacefully died at 10 in the ED with her daughter at bedside.   Disposition planning: Deceased, time of death 72  Signed: Charmayne Holmes, DO Internal Medicine Resident  05/11/24, 11:25 AM  On Call pager: [phone]

## 2024-05-19 NOTE — ED Notes (Signed)
 Dr. Levander and this RN auscultated for a full minute. No HR to be found. TOD 1007. Daughter at bedside.

## 2024-05-19 NOTE — Hospital Course (Signed)
 CODE stemi was called: cards cancelled  dementia, severe aortic stenosis, multivessel coronary artery disease with previous PCI of the RCA, left circumflex, and LAD, CKD stage III AA, permanent atrial fibrillation, dementia, and DNR status   SOB More confused than normal  EMS: low BP, with STEMI on EKG  Cards said nope  Daughter is POA: No aggressive care: Comfort care only Daughter is at bedside   PMT consulted:

## 2024-05-19 NOTE — ED Notes (Signed)
 RN called [name], daughter for patients night gown. Daughter stated to throw away night gown.

## 2024-05-19 NOTE — ED Provider Notes (Addendum)
 [place] EMERGENCY DEPARTMENT AT [facility] Provider Note   CSN: [id number] Arrival date & time: 2024/05/17  9140     Patient presents with: Shortness of Breath and Code STEMI   Olivia Erickson is a 89 y.o. female.   HPI 89 year old female brought in today admitted code STEMI and route.  Patient comes from [facility] independent living.  They report that staff checked on her this morning that she was short of breath, full lethargic, and they could not palpate radial pulses.  She was tachypneic.  EMS did EKG and she was instituted as code STEMI prehospital.  She received 324 mg of aspirin and 300 cc of normal saline and 5 mg of albuterol.  She was reported to be hypotensive on their arrival. Daughter Olivia Erickson healthcare power of attorney at bedside.  She reports that she would has comfort care only.  She reports that her mother had an MI at age 73 and had 6 stents placed.  Since that time they have sided to not do further interventions. Cardiologist initially present at bedside on my initial evaluation and advised no acute intervention.    Prior to Admission medications   Medication Sig Start Date End Date Taking? Authorizing Provider  allopurinol  (ZYLOPRIM ) 100 MG tablet Take 1 tablet (100 mg total) by mouth daily. Patient taking differently: Take 50 mg by mouth daily. 12/26/21   Claudene Victory ORN, MD  apixaban  (ELIQUIS ) 2.5 MG TABS tablet Take 1 tablet (2.5 mg total) by mouth 2 (two) times daily. 11/16/22   Lucien Orren SAILOR, PA-C  Cholecalciferol (VITAMIN D3) 50 MCG (2000 UT) TABS Take 2,000 Units/day by mouth daily.    [provider]  docusate sodium (COLACE) 100 MG capsule Take 100 mg by mouth daily.    [provider]  glipiZIDE  (GLUCOTROL  XL) 2.5 MG 24 hr tablet Take 1 tablet (2.5 mg total) by mouth daily with breakfast. Further refills to be authorized by primary care provider 12/22/22   Lucien Orren SAILOR, PA-C  losartan  (COZAAR ) 25 MG tablet Take 1 tablet (25 mg  total) by mouth daily. 12/25/23   Lucien Orren SAILOR, PA-C  metoprolol  succinate (TOPROL -XL) 50 MG 24 hr tablet Take 1 tablet (50 mg total) by mouth daily. 12/29/22   Lucien Orren SAILOR, PA-C  psyllium (REGULOID) 0.52 g capsule Take 0.52 g by mouth daily.    [provider]  rosuvastatin  (CRESTOR ) 10 MG tablet Take 1 tablet (10 mg total) by mouth daily. 12/22/22   Lucien Orren SAILOR, PA-C  vitamin C (ASCORBIC ACID) 500 MG tablet Take 500 mg by mouth daily.    [provider]    Allergies: Patient has no known allergies.    Review of Systems  Updated Vital Signs BP (!) 143/88   Pulse 99   Resp (!) 24   SpO2 100%   Physical Exam Vitals and nursing note reviewed.  Constitutional:      General: She is not in acute distress. HENT:     Head: Normocephalic.     Mouth/Throat:     Mouth: Mucous membranes are moist.  Eyes:     Pupils: Pupils are equal, round, and reactive to light.  Cardiovascular:     Rate and Rhythm: Normal rate. Rhythm irregular.  Pulmonary:     Effort: Tachypnea present.  Abdominal:     Palpations: Abdomen is soft.  Musculoskeletal:        General: Normal range of motion.     Cervical back:  Normal range of motion.  Skin:    Capillary Refill: Capillary refill takes less than 2 seconds.     Comments: Lower extremities are purple and cold No femoral pulses palpable Upper extremities are slightly discolored with decreased radial pulses  Neurological:     General: No focal deficit present.     Mental Status: She is alert.  Psychiatric:        Mood and Affect: Mood normal.     (all labs ordered are listed, but only abnormal results are displayed) Labs Reviewed  I-STAT CHEM 8, ED  TROPONIN I (HIGH SENSITIVITY)    EKG: EKG Interpretation Date/Time:  Sunday 2024/05/12 09:04:29 EST Ventricular Rate:  106 PR Interval:    QRS Duration:  83 QT Interval:  375 QTC Calculation: 474 R Axis:   59  Text Interpretation: Atrial fibrillation Ventricular  bigeminy Inferolateral infarct, acute (RCA) Anterior infarct, acute Probable RV involvement, suggest recording right precordial leads >>> Acute MI <<< Confirmed by Levander Houston [phone]) on 2024-05-12 9:15:28 AM  Radiology: No results found.   .Critical Care  Performed by: Levander Houston, MD Authorized by: Levander Houston, MD   Critical care provider statement:    Critical care time (minutes):  30   Critical care time was exclusive of:  Separately billable procedures and treating other patients and teaching time   Critical care was time spent personally by me on the following activities:  Development of treatment plan with patient or surrogate, discussions with consultants, evaluation of patient's response to treatment, examination of patient, ordering and review of laboratory studies, ordering and review of radiographic studies, ordering and performing treatments and interventions, pulse oximetry, re-evaluation of patient's condition and review of old charts    Medications Ordered in the ED - No data to display                                  Medical Decision Making Amount and/or Complexity of Data Reviewed Radiology: ordered.  Risk Decision regarding hospitalization.   89 year old female presents today as code STEMI. EKG prehospital reviewed and consistent with inferior lateral ST elevation MI Hospital EKG A-fib with ST elevation consistent with MI Patient denies any pain here.  She is tachypneic.  Her sats are 100%.  Oxygen is placed for patient comfort. Daughter is at bedside.  She wishes comfort care only.  She has healthcare power of attorney.  DNR was previously in place.  However, they have not placed her on hospice or had consult with palliative care.  Specifically, daughter is okay with starting IV but does not want any other interventions except for pain control and comfort. Care discussed with internal medicine teaching service resident and they accept to their service for  ongoing evaluation and treatment   10:09 AM Teaching service at bedside and orders placed However, patient's heart rate decreased to 40 and then to 39 at bedside.  Blood pressure has decreased and patient is unresponsive. Patient became asystolic on monitor and no heart sounds auscultated time of death at 52 Patient is not an ED case.  RN is to obtain primary care name for death certificate  Final diagnoses:  ST elevation myocardial infarction (STEMI), unspecified artery [facility])  Palliative care encounter    ED Discharge Orders     None          Levander Houston, MD 05/12/24 [id number]    Levander Houston, MD 05-12-2024  1010    Levander Houston, MD 2024-04-22 1011

## 2024-05-19 NOTE — Progress Notes (Signed)
 Interventional Cardiology Note:  Called to patient bedside due to STEMI activation.  Patient is a 89 year old female with a history of dementia, severe aortic stenosis, multivessel coronary artery disease with previous PCI of the RCA, left circumflex, and LAD, CKD stage III AA, permanent atrial fibrillation, dementia, and DNR status who was found to be in altered state at her independent living care center.  The patient is unable to give me a history.  She is alert and oriented x 2.  She denies any chest pain or shortness of breath.  Consensus opinion is that given her multiple comorbidities, dementia and advanced age I do not think emergency coronary angiography and possible percutaneous coronary intervention would be of any real benefit in this particular individual.  ED attending to assume care and consider comfort measures.  Will cancel STEMI activation.

## 2024-05-19 DEATH — deceased
